# Patient Record
Sex: Female | Born: 1960 | Race: White | Hispanic: No | Marital: Married | Smoking: Never smoker
Health system: Southern US, Community
[De-identification: ages and names within clinical notes are randomized; demographics above are authoritative.]

## PROBLEM LIST (undated history)

## (undated) DIAGNOSIS — Z8571 Personal history of Hodgkin lymphoma: Secondary | ICD-10-CM

## (undated) DIAGNOSIS — D649 Anemia, unspecified: Secondary | ICD-10-CM

## (undated) DIAGNOSIS — T7840XA Allergy, unspecified, initial encounter: Secondary | ICD-10-CM

## (undated) DIAGNOSIS — B029 Zoster without complications: Secondary | ICD-10-CM

## (undated) DIAGNOSIS — C4491 Basal cell carcinoma of skin, unspecified: Secondary | ICD-10-CM

## (undated) DIAGNOSIS — M1990 Unspecified osteoarthritis, unspecified site: Secondary | ICD-10-CM

## (undated) DIAGNOSIS — R011 Cardiac murmur, unspecified: Secondary | ICD-10-CM

## (undated) DIAGNOSIS — R87619 Unspecified abnormal cytological findings in specimens from cervix uteri: Secondary | ICD-10-CM

## (undated) DIAGNOSIS — M8580 Other specified disorders of bone density and structure, unspecified site: Secondary | ICD-10-CM

## (undated) DIAGNOSIS — K219 Gastro-esophageal reflux disease without esophagitis: Secondary | ICD-10-CM

## (undated) DIAGNOSIS — C50919 Malignant neoplasm of unspecified site of unspecified female breast: Secondary | ICD-10-CM

## (undated) HISTORY — DX: Unspecified osteoarthritis, unspecified site: M19.90

## (undated) HISTORY — DX: Unspecified abnormal cytological findings in specimens from cervix uteri: R87.619

## (undated) HISTORY — PX: BASAL CELL CARCINOMA EXCISION: SHX1214

## (undated) HISTORY — DX: Cardiac murmur, unspecified: R01.1

## (undated) HISTORY — DX: Gastro-esophageal reflux disease without esophagitis: K21.9

## (undated) HISTORY — DX: Basal cell carcinoma of skin, unspecified: C44.91

## (undated) HISTORY — PX: LYMPH NODE BIOPSY: SHX201

## (undated) HISTORY — DX: Anemia, unspecified: D64.9

## (undated) HISTORY — DX: Malignant neoplasm of unspecified site of unspecified female breast: C50.919

## (undated) HISTORY — DX: Other specified disorders of bone density and structure, unspecified site: M85.80

## (undated) HISTORY — DX: Allergy, unspecified, initial encounter: T78.40XA

## (undated) HISTORY — DX: Zoster without complications: B02.9

## (undated) HISTORY — DX: Personal history of Hodgkin lymphoma: Z85.71

---

## 1989-03-09 DIAGNOSIS — Z8571 Personal history of Hodgkin lymphoma: Secondary | ICD-10-CM

## 1989-03-09 HISTORY — DX: Personal history of Hodgkin lymphoma: Z85.71

## 2006-04-02 ENCOUNTER — Other Ambulatory Visit: Admission: RE | Admit: 2006-04-02 | Discharge: 2006-04-02 | Payer: Self-pay | Admitting: Obstetrics & Gynecology

## 2007-07-01 ENCOUNTER — Other Ambulatory Visit: Admission: RE | Admit: 2007-07-01 | Discharge: 2007-07-01 | Payer: Self-pay | Admitting: Obstetrics & Gynecology

## 2007-07-01 ENCOUNTER — Encounter: Payer: Self-pay | Admitting: Internal Medicine

## 2007-11-23 ENCOUNTER — Ambulatory Visit: Payer: Self-pay | Admitting: Internal Medicine

## 2007-11-23 ENCOUNTER — Encounter: Payer: Self-pay | Admitting: Internal Medicine

## 2007-11-23 DIAGNOSIS — R05 Cough: Secondary | ICD-10-CM

## 2007-11-23 DIAGNOSIS — D259 Leiomyoma of uterus, unspecified: Secondary | ICD-10-CM | POA: Insufficient documentation

## 2007-11-23 DIAGNOSIS — Z8571 Personal history of Hodgkin lymphoma: Secondary | ICD-10-CM | POA: Insufficient documentation

## 2007-11-23 DIAGNOSIS — Z85828 Personal history of other malignant neoplasm of skin: Secondary | ICD-10-CM

## 2007-11-23 DIAGNOSIS — R059 Cough, unspecified: Secondary | ICD-10-CM | POA: Insufficient documentation

## 2007-11-23 DIAGNOSIS — F4321 Adjustment disorder with depressed mood: Secondary | ICD-10-CM

## 2007-11-24 DIAGNOSIS — J309 Allergic rhinitis, unspecified: Secondary | ICD-10-CM | POA: Insufficient documentation

## 2007-11-28 ENCOUNTER — Ambulatory Visit: Payer: Self-pay | Admitting: Internal Medicine

## 2007-12-08 ENCOUNTER — Ambulatory Visit: Payer: Self-pay | Admitting: Pulmonary Disease

## 2007-12-29 ENCOUNTER — Ambulatory Visit: Payer: Self-pay | Admitting: Pulmonary Disease

## 2007-12-29 DIAGNOSIS — K219 Gastro-esophageal reflux disease without esophagitis: Secondary | ICD-10-CM | POA: Insufficient documentation

## 2008-07-07 HISTORY — PX: BREAST LUMPECTOMY: SHX2

## 2008-07-07 HISTORY — PX: BREAST BIOPSY: SHX20

## 2008-08-01 ENCOUNTER — Ambulatory Visit: Payer: Self-pay | Admitting: Oncology

## 2008-08-08 ENCOUNTER — Encounter: Admission: RE | Admit: 2008-08-08 | Discharge: 2008-08-08 | Payer: Self-pay | Admitting: General Surgery

## 2008-08-28 ENCOUNTER — Encounter (INDEPENDENT_AMBULATORY_CARE_PROVIDER_SITE_OTHER): Payer: Self-pay | Admitting: General Surgery

## 2008-08-28 ENCOUNTER — Ambulatory Visit (HOSPITAL_COMMUNITY): Admission: RE | Admit: 2008-08-28 | Discharge: 2008-08-29 | Payer: Self-pay | Admitting: General Surgery

## 2008-08-28 HISTORY — PX: MASTECTOMY: SHX3

## 2008-09-06 ENCOUNTER — Ambulatory Visit: Admission: RE | Admit: 2008-09-06 | Discharge: 2008-10-11 | Payer: Self-pay | Admitting: Radiation Oncology

## 2008-09-06 HISTORY — PX: PORTACATH PLACEMENT: SHX2246

## 2008-09-12 ENCOUNTER — Encounter: Payer: Self-pay | Admitting: Internal Medicine

## 2008-09-12 ENCOUNTER — Ambulatory Visit: Payer: Self-pay | Admitting: Oncology

## 2008-09-12 LAB — COMPREHENSIVE METABOLIC PANEL
ALT: 87 U/L — ABNORMAL HIGH (ref 0–35)
AST: 52 U/L — ABNORMAL HIGH (ref 0–37)
CO2: 31 mEq/L (ref 19–32)
Chloride: 99 mEq/L (ref 96–112)
Sodium: 138 mEq/L (ref 135–145)
Total Bilirubin: 0.6 mg/dL (ref 0.3–1.2)
Total Protein: 6.9 g/dL (ref 6.0–8.3)

## 2008-09-12 LAB — CBC WITH DIFFERENTIAL/PLATELET
Eosinophils Absolute: 0.3 10*3/uL (ref 0.0–0.5)
HCT: 37 % (ref 34.8–46.6)
HGB: 12.8 g/dL (ref 11.6–15.9)
LYMPH%: 16.7 % (ref 14.0–49.7)
MONO#: 0.6 10*3/uL (ref 0.1–0.9)
NEUT#: 5.4 10*3/uL (ref 1.5–6.5)
NEUT%: 70.4 % (ref 38.4–76.8)
Platelets: 436 10*3/uL — ABNORMAL HIGH (ref 145–400)
WBC: 7.7 10*3/uL (ref 3.9–10.3)
lymph#: 1.3 10*3/uL (ref 0.9–3.3)

## 2008-09-12 LAB — LACTATE DEHYDROGENASE: LDH: 134 U/L (ref 94–250)

## 2008-09-13 LAB — TSH: TSH: 3.756 u[IU]/mL (ref 0.350–4.500)

## 2008-09-26 ENCOUNTER — Ambulatory Visit (HOSPITAL_COMMUNITY): Admission: RE | Admit: 2008-09-26 | Discharge: 2008-09-26 | Payer: Self-pay | Admitting: Oncology

## 2008-10-02 ENCOUNTER — Ambulatory Visit (HOSPITAL_COMMUNITY): Admission: AD | Admit: 2008-10-02 | Discharge: 2008-10-02 | Payer: Self-pay | Admitting: General Surgery

## 2008-10-03 ENCOUNTER — Encounter: Payer: Self-pay | Admitting: Internal Medicine

## 2008-10-07 DIAGNOSIS — C50919 Malignant neoplasm of unspecified site of unspecified female breast: Secondary | ICD-10-CM

## 2008-10-07 HISTORY — DX: Malignant neoplasm of unspecified site of unspecified female breast: C50.919

## 2008-10-09 LAB — COMPREHENSIVE METABOLIC PANEL
Albumin: 4.2 g/dL (ref 3.5–5.2)
CO2: 26 mEq/L (ref 19–32)
Glucose, Bld: 144 mg/dL — ABNORMAL HIGH (ref 70–99)
Sodium: 136 mEq/L (ref 135–145)
Total Bilirubin: 0.7 mg/dL (ref 0.3–1.2)
Total Protein: 7.8 g/dL (ref 6.0–8.3)

## 2008-10-09 LAB — HEPATITIS B CORE ANTIBODY, TOTAL: Hep B Core Total Ab: NEGATIVE

## 2008-10-09 LAB — HEPATITIS B SURFACE ANTIGEN: Hepatitis B Surface Ag: NEGATIVE

## 2008-10-10 ENCOUNTER — Encounter: Payer: Self-pay | Admitting: Internal Medicine

## 2008-10-11 ENCOUNTER — Ambulatory Visit: Payer: Self-pay | Admitting: Oncology

## 2008-10-15 LAB — CBC WITH DIFFERENTIAL/PLATELET
Basophils Absolute: 0.1 10*3/uL (ref 0.0–0.1)
Eosinophils Absolute: 0.2 10*3/uL (ref 0.0–0.5)
HCT: 36.7 % (ref 34.8–46.6)
HGB: 12.8 g/dL (ref 11.6–15.9)
MONO#: 0.1 10*3/uL (ref 0.1–0.9)
NEUT%: 30.6 % — ABNORMAL LOW (ref 38.4–76.8)
WBC: 1.5 10*3/uL — ABNORMAL LOW (ref 3.9–10.3)
lymph#: 0.8 10*3/uL — ABNORMAL LOW (ref 0.9–3.3)

## 2008-10-29 LAB — CBC WITH DIFFERENTIAL/PLATELET
Basophils Absolute: 0.1 10*3/uL (ref 0.0–0.1)
Eosinophils Absolute: 0.2 10*3/uL (ref 0.0–0.5)
HGB: 12.8 g/dL (ref 11.6–15.9)
LYMPH%: 17.5 % (ref 14.0–49.7)
MCV: 87.3 fL (ref 79.5–101.0)
MONO%: 9.6 % (ref 0.0–14.0)
NEUT#: 4.3 10*3/uL (ref 1.5–6.5)
Platelets: 227 10*3/uL (ref 145–400)
RDW: 12.9 % (ref 11.2–14.5)

## 2008-10-30 LAB — COMPREHENSIVE METABOLIC PANEL
CO2: 24 mEq/L (ref 19–32)
Calcium: 9.8 mg/dL (ref 8.4–10.5)
Glucose, Bld: 143 mg/dL — ABNORMAL HIGH (ref 70–99)
Sodium: 137 mEq/L (ref 135–145)
Total Bilirubin: 0.5 mg/dL (ref 0.3–1.2)
Total Protein: 7.5 g/dL (ref 6.0–8.3)

## 2008-11-06 LAB — CBC WITH DIFFERENTIAL/PLATELET
Eosinophils Absolute: 0.3 10*3/uL (ref 0.0–0.5)
LYMPH%: 16 % (ref 14.0–49.7)
MONO#: 2.3 10*3/uL — ABNORMAL HIGH (ref 0.1–0.9)
NEUT#: 5.5 10*3/uL (ref 1.5–6.5)
Platelets: 267 10*3/uL (ref 145–400)
RBC: 4.36 10*6/uL (ref 3.70–5.45)
RDW: 13.2 % (ref 11.2–14.5)
WBC: 9.8 10*3/uL (ref 3.9–10.3)
lymph#: 1.6 10*3/uL (ref 0.9–3.3)

## 2008-11-16 ENCOUNTER — Ambulatory Visit: Payer: Self-pay | Admitting: Oncology

## 2008-11-20 LAB — CBC WITH DIFFERENTIAL/PLATELET
Eosinophils Absolute: 0 10*3/uL (ref 0.0–0.5)
HCT: 34.4 % — ABNORMAL LOW (ref 34.8–46.6)
HGB: 11.7 g/dL (ref 11.6–15.9)
LYMPH%: 3.9 % — ABNORMAL LOW (ref 14.0–49.7)
MONO#: 0.6 10*3/uL (ref 0.1–0.9)
NEUT#: 10.8 10*3/uL — ABNORMAL HIGH (ref 1.5–6.5)
Platelets: 383 10*3/uL (ref 145–400)
RBC: 3.95 10*6/uL (ref 3.70–5.45)
WBC: 11.9 10*3/uL — ABNORMAL HIGH (ref 3.9–10.3)

## 2008-11-20 LAB — COMPREHENSIVE METABOLIC PANEL
BUN: 15 mg/dL (ref 6–23)
CO2: 26 mEq/L (ref 19–32)
Creatinine, Ser: 0.52 mg/dL (ref 0.40–1.20)
Glucose, Bld: 123 mg/dL — ABNORMAL HIGH (ref 70–99)
Total Bilirubin: 0.7 mg/dL (ref 0.3–1.2)
Total Protein: 6.7 g/dL (ref 6.0–8.3)

## 2008-12-11 LAB — CBC WITH DIFFERENTIAL/PLATELET
Eosinophils Absolute: 0 10*3/uL (ref 0.0–0.5)
HCT: 35.9 % (ref 34.8–46.6)
LYMPH%: 2.8 % — ABNORMAL LOW (ref 14.0–49.7)
MCHC: 33.4 g/dL (ref 31.5–36.0)
MCV: 88.9 fL (ref 79.5–101.0)
MONO#: 0.4 10*3/uL (ref 0.1–0.9)
MONO%: 3.5 % (ref 0.0–14.0)
NEUT#: 11.7 10*3/uL — ABNORMAL HIGH (ref 1.5–6.5)
NEUT%: 93.6 % — ABNORMAL HIGH (ref 38.4–76.8)
Platelets: 342 10*3/uL (ref 145–400)
RBC: 4.04 10*6/uL (ref 3.70–5.45)
WBC: 12.5 10*3/uL — ABNORMAL HIGH (ref 3.9–10.3)

## 2008-12-11 LAB — COMPREHENSIVE METABOLIC PANEL
ALT: 26 U/L (ref 0–35)
Alkaline Phosphatase: 95 U/L (ref 39–117)
CO2: 25 mEq/L (ref 19–32)
Creatinine, Ser: 0.53 mg/dL (ref 0.40–1.20)
Sodium: 135 mEq/L (ref 135–145)
Total Bilirubin: 0.9 mg/dL (ref 0.3–1.2)
Total Protein: 6.8 g/dL (ref 6.0–8.3)

## 2008-12-18 ENCOUNTER — Ambulatory Visit: Payer: Self-pay | Admitting: Oncology

## 2008-12-18 LAB — CBC WITH DIFFERENTIAL/PLATELET
BASO%: 0.9 % (ref 0.0–2.0)
EOS%: 2.2 % (ref 0.0–7.0)
HCT: 36.1 % (ref 34.8–46.6)
LYMPH%: 8 % — ABNORMAL LOW (ref 14.0–49.7)
MCH: 30.5 pg (ref 25.1–34.0)
MCHC: 33.8 g/dL (ref 31.5–36.0)
MONO%: 17.9 % — ABNORMAL HIGH (ref 0.0–14.0)
NEUT%: 71 % (ref 38.4–76.8)
Platelets: 233 10*3/uL (ref 145–400)
RBC: 4 10*6/uL (ref 3.70–5.45)
nRBC: 0 % (ref 0–0)

## 2009-01-01 LAB — CBC WITH DIFFERENTIAL/PLATELET
Basophils Absolute: 0 10*3/uL (ref 0.0–0.1)
EOS%: 0 % (ref 0.0–7.0)
HCT: 34.1 % — ABNORMAL LOW (ref 34.8–46.6)
HGB: 11.4 g/dL — ABNORMAL LOW (ref 11.6–15.9)
LYMPH%: 3.1 % — ABNORMAL LOW (ref 14.0–49.7)
MCH: 30.4 pg (ref 25.1–34.0)
MCV: 90.9 fL (ref 79.5–101.0)
NEUT%: 87.1 % — ABNORMAL HIGH (ref 38.4–76.8)
Platelets: 381 10*3/uL (ref 145–400)
lymph#: 0.3 10*3/uL — ABNORMAL LOW (ref 0.9–3.3)

## 2009-01-01 LAB — COMPREHENSIVE METABOLIC PANEL
AST: 26 U/L (ref 0–37)
Alkaline Phosphatase: 92 U/L (ref 39–117)
BUN: 16 mg/dL (ref 6–23)
Calcium: 9.5 mg/dL (ref 8.4–10.5)
Creatinine, Ser: 0.52 mg/dL (ref 0.40–1.20)
Total Bilirubin: 0.6 mg/dL (ref 0.3–1.2)

## 2009-01-08 LAB — CBC WITH DIFFERENTIAL/PLATELET
Basophils Absolute: 0.1 10*3/uL (ref 0.0–0.1)
EOS%: 2.4 % (ref 0.0–7.0)
LYMPH%: 10.9 % — ABNORMAL LOW (ref 14.0–49.7)
MCH: 30.7 pg (ref 25.1–34.0)
MCV: 91.3 fL (ref 79.5–101.0)
MONO%: 17.1 % — ABNORMAL HIGH (ref 0.0–14.0)
Platelets: 229 10*3/uL (ref 145–400)
RBC: 3.81 10*6/uL (ref 3.70–5.45)
RDW: 16.9 % — ABNORMAL HIGH (ref 11.2–14.5)
nRBC: 0 % (ref 0–0)

## 2009-01-18 ENCOUNTER — Ambulatory Visit: Payer: Self-pay | Admitting: Oncology

## 2009-01-22 LAB — CBC WITH DIFFERENTIAL/PLATELET
BASO%: 0 % (ref 0.0–2.0)
EOS%: 0 % (ref 0.0–7.0)
LYMPH%: 4.1 % — ABNORMAL LOW (ref 14.0–49.7)
MCH: 31.8 pg (ref 25.1–34.0)
MCHC: 33.8 g/dL (ref 31.5–36.0)
MONO#: 0.6 10*3/uL (ref 0.1–0.9)
NEUT%: 90.1 % — ABNORMAL HIGH (ref 38.4–76.8)
Platelets: 389 10*3/uL (ref 145–400)
RBC: 3.77 10*6/uL (ref 3.70–5.45)
WBC: 10.6 10*3/uL — ABNORMAL HIGH (ref 3.9–10.3)

## 2009-01-22 LAB — COMPREHENSIVE METABOLIC PANEL
ALT: 27 U/L (ref 0–35)
AST: 32 U/L (ref 0–37)
Alkaline Phosphatase: 103 U/L (ref 39–117)
CO2: 26 mEq/L (ref 19–32)
Creatinine, Ser: 0.64 mg/dL (ref 0.40–1.20)
Sodium: 140 mEq/L (ref 135–145)
Total Bilirubin: 0.5 mg/dL (ref 0.3–1.2)
Total Protein: 6.3 g/dL (ref 6.0–8.3)

## 2009-01-29 LAB — CBC WITH DIFFERENTIAL/PLATELET
BASO%: 0.5 % (ref 0.0–2.0)
HCT: 35.8 % (ref 34.8–46.6)
LYMPH%: 12 % — ABNORMAL LOW (ref 14.0–49.7)
MCH: 31.7 pg (ref 25.1–34.0)
MCHC: 33.3 g/dL (ref 31.5–36.0)
MCV: 95.3 fL (ref 79.5–101.0)
MONO%: 11.9 % (ref 0.0–14.0)
NEUT%: 73 % (ref 38.4–76.8)
Platelets: 258 10*3/uL (ref 145–400)
RBC: 3.76 10*6/uL (ref 3.70–5.45)
WBC: 9.5 10*3/uL (ref 3.9–10.3)

## 2009-02-12 ENCOUNTER — Ambulatory Visit: Admission: RE | Admit: 2009-02-12 | Discharge: 2009-03-08 | Payer: Self-pay | Admitting: Radiation Oncology

## 2009-02-13 ENCOUNTER — Encounter: Payer: Self-pay | Admitting: Internal Medicine

## 2009-03-05 ENCOUNTER — Ambulatory Visit: Payer: Self-pay | Admitting: Oncology

## 2009-03-05 LAB — CBC WITH DIFFERENTIAL/PLATELET
Basophils Absolute: 0 10*3/uL (ref 0.0–0.1)
EOS%: 28.9 % — ABNORMAL HIGH (ref 0.0–7.0)
HCT: 38.4 % (ref 34.8–46.6)
HGB: 12.8 g/dL (ref 11.6–15.9)
MCH: 31.5 pg (ref 25.1–34.0)
MCV: 94.3 fL (ref 79.5–101.0)
MONO%: 9.2 % (ref 0.0–14.0)
NEUT%: 46.6 % (ref 38.4–76.8)
Platelets: 255 10*3/uL (ref 145–400)

## 2009-03-07 ENCOUNTER — Ambulatory Visit (HOSPITAL_COMMUNITY): Admission: RE | Admit: 2009-03-07 | Discharge: 2009-03-07 | Payer: Self-pay | Admitting: Obstetrics & Gynecology

## 2009-03-07 HISTORY — PX: LAPAROSCOPIC BILATERAL SALPINGO OOPHERECTOMY: SHX5890

## 2009-03-13 ENCOUNTER — Ambulatory Visit: Admission: RE | Admit: 2009-03-13 | Discharge: 2009-05-14 | Payer: Self-pay | Admitting: Radiation Oncology

## 2009-06-10 ENCOUNTER — Ambulatory Visit: Payer: Self-pay | Admitting: Oncology

## 2009-06-10 LAB — COMPREHENSIVE METABOLIC PANEL
ALT: 17 U/L (ref 0–35)
AST: 19 U/L (ref 0–37)
Calcium: 9.6 mg/dL (ref 8.4–10.5)
Chloride: 102 mEq/L (ref 96–112)
Creatinine, Ser: 0.7 mg/dL (ref 0.40–1.20)
Sodium: 138 mEq/L (ref 135–145)
Total Protein: 7.1 g/dL (ref 6.0–8.3)

## 2009-06-10 LAB — CBC WITH DIFFERENTIAL/PLATELET
BASO%: 0.7 % (ref 0.0–2.0)
EOS%: 10.9 % — ABNORMAL HIGH (ref 0.0–7.0)
HCT: 39.4 % (ref 34.8–46.6)
MCH: 30.8 pg (ref 25.1–34.0)
MCHC: 34.5 g/dL (ref 31.5–36.0)
NEUT%: 59.5 % (ref 38.4–76.8)
RBC: 4.41 10*6/uL (ref 3.70–5.45)
RDW: 14.5 % (ref 11.2–14.5)
WBC: 4.8 10*3/uL (ref 3.9–10.3)
lymph#: 1 10*3/uL (ref 0.9–3.3)

## 2009-06-14 ENCOUNTER — Encounter (INDEPENDENT_AMBULATORY_CARE_PROVIDER_SITE_OTHER): Payer: Self-pay | Admitting: *Deleted

## 2009-06-20 ENCOUNTER — Encounter: Admission: RE | Admit: 2009-06-20 | Discharge: 2009-07-15 | Payer: Self-pay | Admitting: Oncology

## 2009-08-20 ENCOUNTER — Ambulatory Visit (HOSPITAL_BASED_OUTPATIENT_CLINIC_OR_DEPARTMENT_OTHER): Payer: 59 | Admitting: Oncology

## 2009-08-30 LAB — CBC WITH DIFFERENTIAL/PLATELET
Basophils Absolute: 0 10*3/uL (ref 0.0–0.1)
Eosinophils Absolute: 0.3 10*3/uL (ref 0.0–0.5)
HGB: 13 g/dL (ref 11.6–15.9)
MONO#: 0.4 10*3/uL (ref 0.1–0.9)
NEUT#: 2.2 10*3/uL (ref 1.5–6.5)
Platelets: 261 10*3/uL (ref 145–400)
RBC: 4.17 10*6/uL (ref 3.70–5.45)
RDW: 13.4 % (ref 11.2–14.5)
WBC: 4.1 10*3/uL (ref 3.9–10.3)

## 2009-08-31 LAB — TSH: TSH: 2.897 u[IU]/mL (ref 0.350–4.500)

## 2009-08-31 LAB — COMPREHENSIVE METABOLIC PANEL
Albumin: 4.5 g/dL (ref 3.5–5.2)
BUN: 20 mg/dL (ref 6–23)
CO2: 22 mEq/L (ref 19–32)
Calcium: 9.8 mg/dL (ref 8.4–10.5)
Glucose, Bld: 98 mg/dL (ref 70–99)
Potassium: 4.3 mEq/L (ref 3.5–5.3)
Sodium: 139 mEq/L (ref 135–145)
Total Protein: 6.7 g/dL (ref 6.0–8.3)

## 2009-08-31 LAB — VITAMIN D 25 HYDROXY (VIT D DEFICIENCY, FRACTURES): Vit D, 25-Hydroxy: 36 ng/mL (ref 30–89)

## 2010-02-06 HISTORY — PX: TIBIA FRACTURE SURGERY: SHX806

## 2010-02-10 ENCOUNTER — Encounter: Admission: RE | Admit: 2010-02-10 | Discharge: 2010-02-10 | Payer: Self-pay | Admitting: Orthopedic Surgery

## 2010-02-13 ENCOUNTER — Ambulatory Visit (HOSPITAL_COMMUNITY)
Admission: RE | Admit: 2010-02-13 | Discharge: 2010-02-15 | Payer: Self-pay | Source: Home / Self Care | Attending: Orthopedic Surgery | Admitting: Orthopedic Surgery

## 2010-04-08 NOTE — Letter (Signed)
Summary: [facility]  [facility]   Imported By: Maryln Gottron 03/25/2009 10:02:57  _____________________________________________________________________  External Attachment:    Type:   Image     Comment:   External Document

## 2010-04-08 NOTE — Letter (Signed)
Summary: Referral - not able to see patient  [facility]  [address] [place], [place] [address]   Phone: [phone]  Fax: [phone]     June 14, 2009    [facility] [address] [place], [place] [address]    Re:   Denise Hull DOB:  24-Jul-1960 MRN:   [id number]    Dear [facility] :  Thank you for your kind referral of the above patient.  We have attempted to schedule the recommended procedure Screening Colonoscopy but have not been able to schedule because:   X  The patient was not available by phone and/or has not returned our calls.  ___ The patient declined to schedule the procedure at this time.  We appreciate the referral and hope that we will have the opportunity to treat this patient in the future.    Sincerely,    [facility] [phone]

## 2010-04-10 ENCOUNTER — Ambulatory Visit (HOSPITAL_BASED_OUTPATIENT_CLINIC_OR_DEPARTMENT_OTHER): Payer: 59 | Admitting: Oncology

## 2010-04-10 DIAGNOSIS — Z8571 Personal history of Hodgkin lymphoma: Secondary | ICD-10-CM

## 2010-04-10 DIAGNOSIS — C50419 Malignant neoplasm of upper-outer quadrant of unspecified female breast: Secondary | ICD-10-CM

## 2010-04-10 DIAGNOSIS — C50919 Malignant neoplasm of unspecified site of unspecified female breast: Secondary | ICD-10-CM

## 2010-04-10 LAB — CBC WITH DIFFERENTIAL/PLATELET
BASO%: 0.5 % (ref 0.0–2.0)
Basophils Absolute: 0 10*3/uL (ref 0.0–0.1)
EOS%: 3.3 % (ref 0.0–7.0)
HCT: 40.2 % (ref 34.8–46.6)
HGB: 13.7 g/dL (ref 11.6–15.9)
LYMPH%: 27.7 % (ref 14.0–49.7)
MCH: 30.8 pg (ref 25.1–34.0)
MCHC: 34.1 g/dL (ref 31.5–36.0)
MCV: 90.3 fL (ref 79.5–101.0)
NEUT%: 60.9 % (ref 38.4–76.8)
Platelets: 278 10*3/uL (ref 145–400)
lymph#: 1.1 10*3/uL (ref 0.9–3.3)

## 2010-04-11 LAB — COMPREHENSIVE METABOLIC PANEL
ALT: 20 U/L (ref 0–35)
AST: 19 U/L (ref 0–37)
BUN: 19 mg/dL (ref 6–23)
Calcium: 10.2 mg/dL (ref 8.4–10.5)
Chloride: 100 mEq/L (ref 96–112)
Creatinine, Ser: 0.77 mg/dL (ref 0.40–1.20)
Total Bilirubin: 0.6 mg/dL (ref 0.3–1.2)

## 2010-04-11 LAB — CANCER ANTIGEN 27.29: CA 27.29: 17 U/mL (ref 0–39)

## 2010-05-06 ENCOUNTER — Other Ambulatory Visit: Payer: Self-pay | Admitting: Dermatology

## 2010-05-20 LAB — CBC
HCT: 40.5 % (ref 36.0–46.0)
Hemoglobin: 13.8 g/dL (ref 12.0–15.0)
MCH: 30.6 pg (ref 26.0–34.0)
MCHC: 34.1 g/dL (ref 30.0–36.0)

## 2010-05-20 LAB — COMPREHENSIVE METABOLIC PANEL
CO2: 28 mEq/L (ref 19–32)
Calcium: 10.1 mg/dL (ref 8.4–10.5)
Creatinine, Ser: 0.73 mg/dL (ref 0.4–1.2)
GFR calc Af Amer: >60 mL/min (ref >60)
GFR calc non Af Amer: >60 mL/min (ref >60)
Glucose, Bld: 95 mg/dL (ref 70–99)

## 2010-05-20 LAB — APTT: aPTT: 23 seconds — ABNORMAL LOW (ref 24–37)

## 2010-05-20 LAB — URINALYSIS, ROUTINE W REFLEX MICROSCOPIC
Ketones, ur: 15 mg/dL — AB
Nitrite: NEGATIVE
Protein, ur: NEGATIVE mg/dL

## 2010-05-20 LAB — SURGICAL PCR SCREEN
MRSA, PCR: NEGATIVE
Staphylococcus aureus: NEGATIVE

## 2010-05-20 LAB — PROTIME-INR: Prothrombin Time: 13 seconds (ref 11.6–15.2)

## 2010-06-09 LAB — PREGNANCY, URINE: Preg Test, Ur: NEGATIVE

## 2010-06-09 LAB — URINALYSIS, ROUTINE W REFLEX MICROSCOPIC
Bilirubin Urine: NEGATIVE
Ketones, ur: NEGATIVE mg/dL
Nitrite: NEGATIVE
Protein, ur: NEGATIVE mg/dL
Specific Gravity, Urine: 1.015 (ref 1.005–1.030)
Urobilinogen, UA: 0.2 mg/dL (ref 0.0–1.0)

## 2010-06-09 LAB — URINE MICROSCOPIC-ADD ON

## 2010-06-16 LAB — COMPREHENSIVE METABOLIC PANEL
ALT: 27 U/L (ref 0–35)
AST: 27 U/L (ref 0–37)
Albumin: 4.3 g/dL (ref 3.5–5.2)
Alkaline Phosphatase: 75 U/L (ref 39–117)
BUN: 16 mg/dL (ref 6–23)
CO2: 27 mEq/L (ref 19–32)
Calcium: 10.2 mg/dL (ref 8.4–10.5)
Creatinine, Ser: 0.7 mg/dL (ref 0.4–1.2)
Glucose, Bld: 73 mg/dL (ref 70–99)
Total Bilirubin: 0.7 mg/dL (ref 0.3–1.2)

## 2010-06-16 LAB — CBC
HCT: 42.7 % (ref 36.0–46.0)
Hemoglobin: 14.6 g/dL (ref 12.0–15.0)
MCHC: 34.2 g/dL (ref 30.0–36.0)
MCV: 92.1 fL (ref 78.0–100.0)
RBC: 4.63 MIL/uL (ref 3.87–5.11)
RDW: 13.1 % (ref 11.5–15.5)

## 2010-06-16 LAB — DIFFERENTIAL
Basophils Absolute: 0 10*3/uL (ref 0.0–0.1)
Basophils Relative: 1 % (ref 0–1)
Eosinophils Relative: 5 % (ref 0–5)
Lymphocytes Relative: 19 % (ref 12–46)
Monocytes Absolute: 0.6 10*3/uL (ref 0.1–1.0)

## 2010-07-01 IMAGING — CT CT CHEST W/ CM
2 of 4 series · 15 of 36 positions shown, 18 images · IV contrast (agent unspecified)
Comparison: Bone scan done today.

CT CHEST

CLINICAL DATA: Breast cancer status post bilateral mastectomy and
tissue expander placement.  Elevated liver function tests.

CT CHEST AND ABDOMEN WITH CONTRAST
TECHNIQUE: Multidetector CT imaging of the chest and abdomen was
performed following the standard protocol during bolus
administration of intravenous contrast. (The patient originally had
been scheduled for MRI which was attempted; this was nondiagnostic
due to artifact related to the breast tissue expanders.)
Contrast: 80 ml Mmnipaque-F88 intravenously.

[Series 2: chest with st · axial · 0.76mm/px · z∈[-414,-34]mm · 12 of 88 slices shown, 15 images]
[im 6/88  mediastinal]
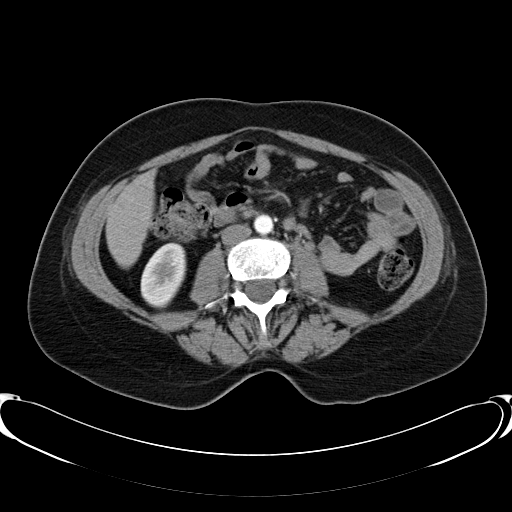
[im 6/88  lung]
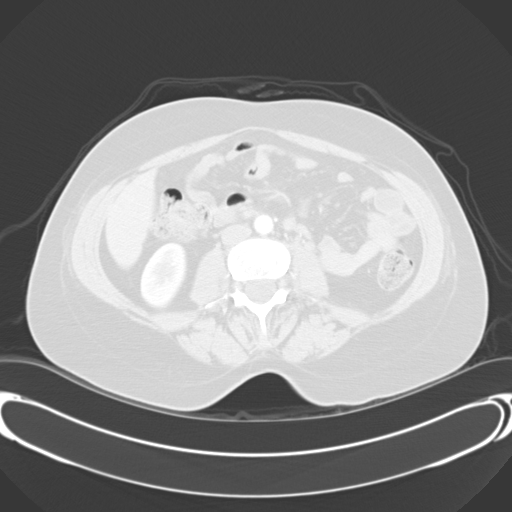
[im 12/88  lung]
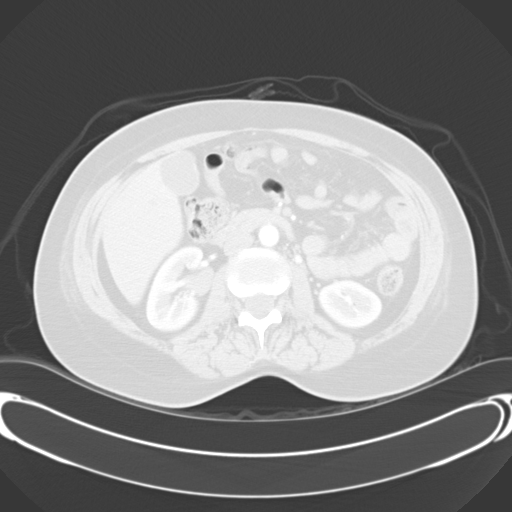
[im 18/88  lung]
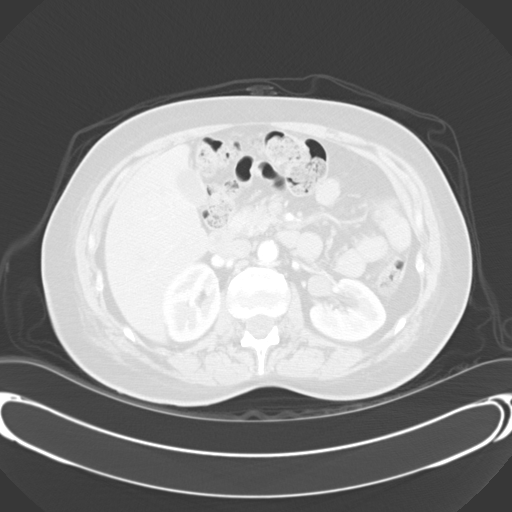
[im 30/88  lung]
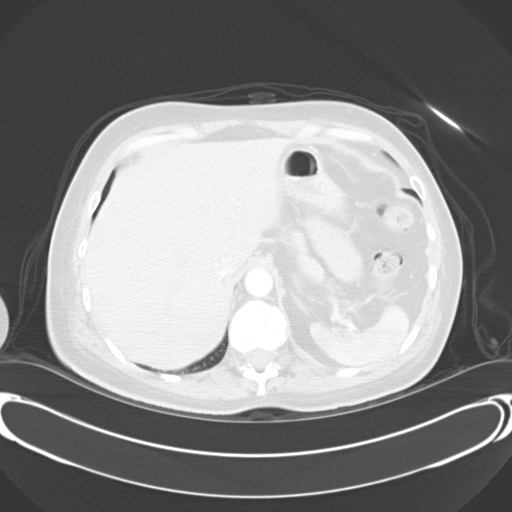
[im 35/88  mediastinal]
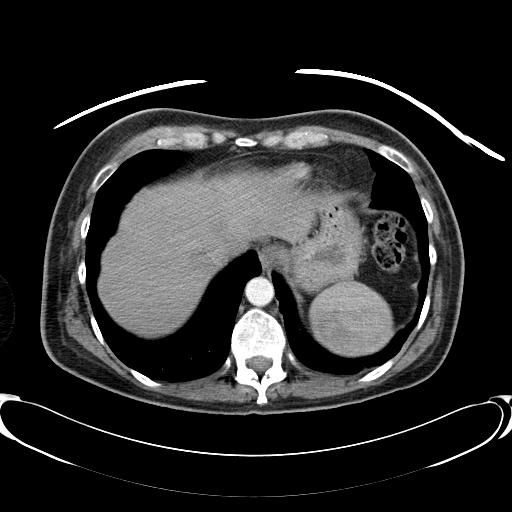
[im 35/88  lung]
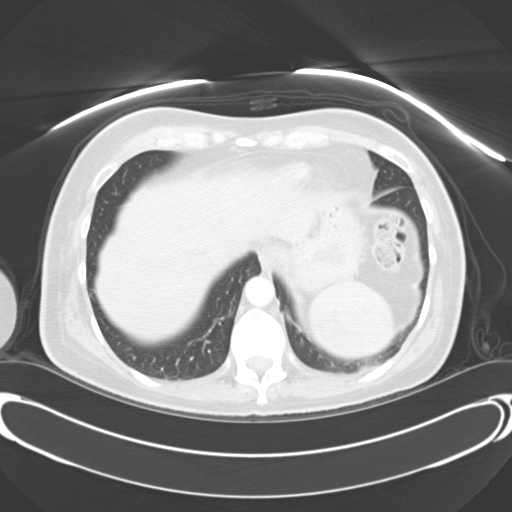
[im 41/88  lung]
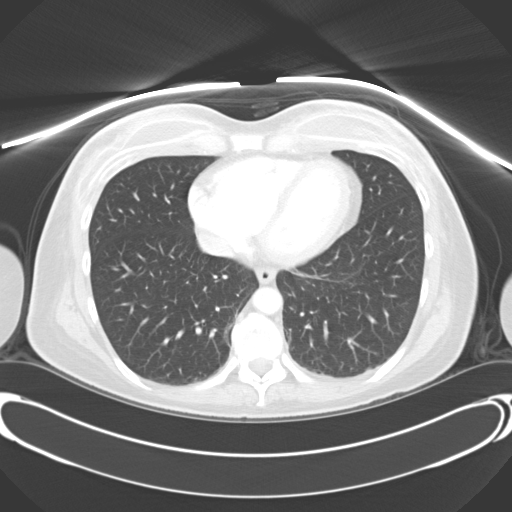
[im 47/88  lung]
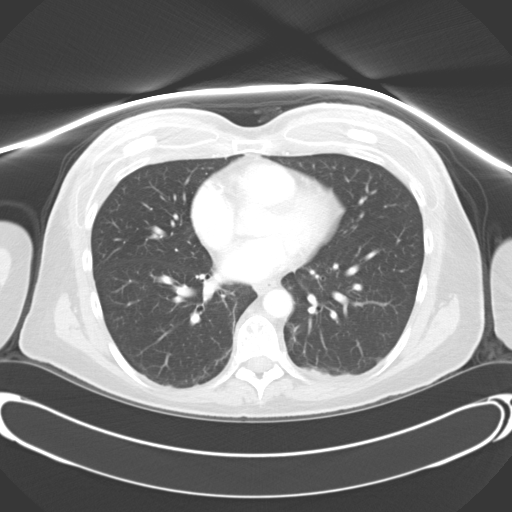
[im 53/88  lung]
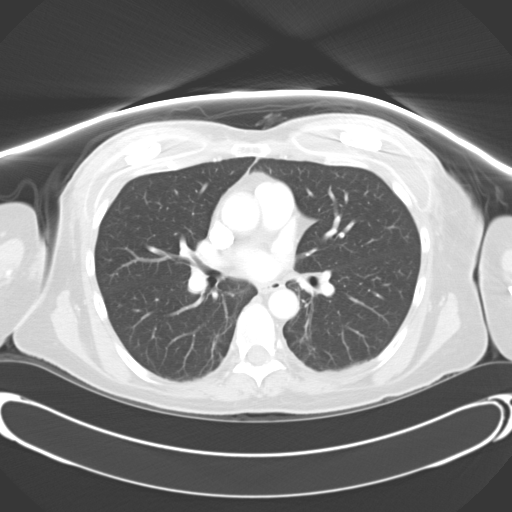
[im 59/88  mediastinal]
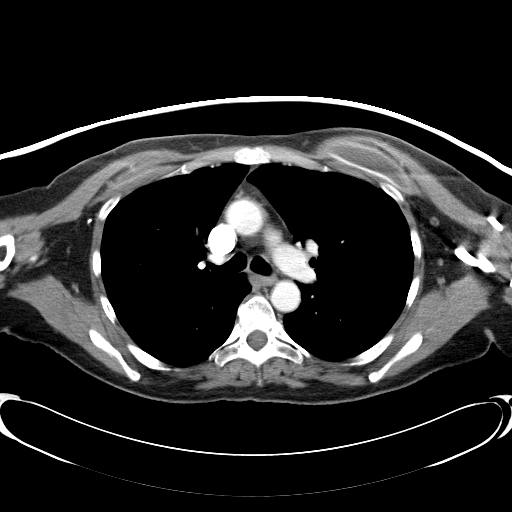
[im 59/88  lung]
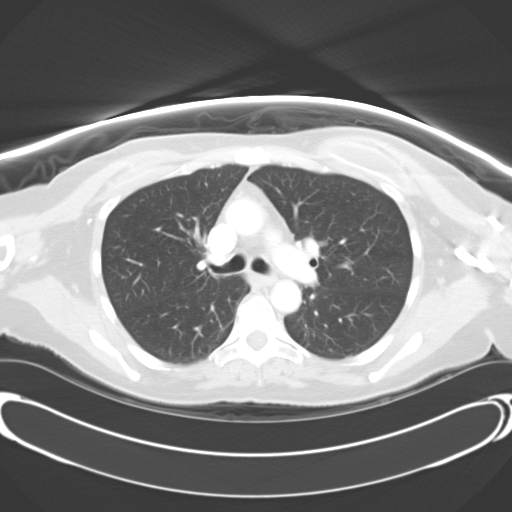
[im 70/88  lung]
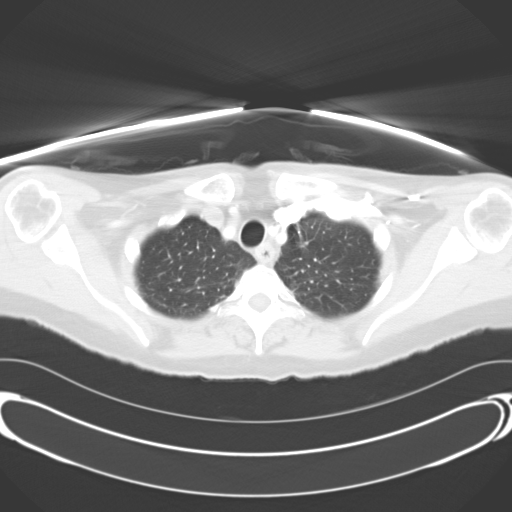
[im 76/88  lung]
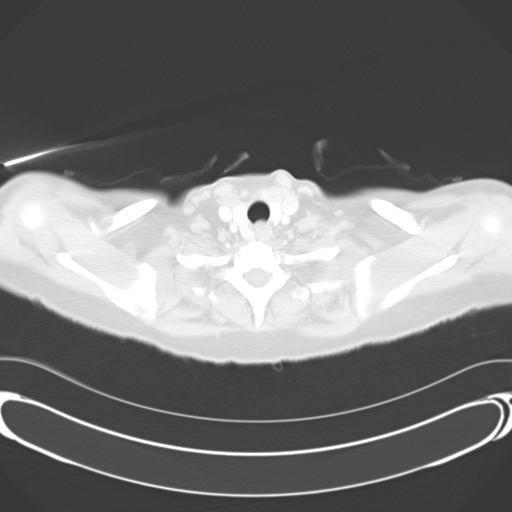
[im 82/88  lung]
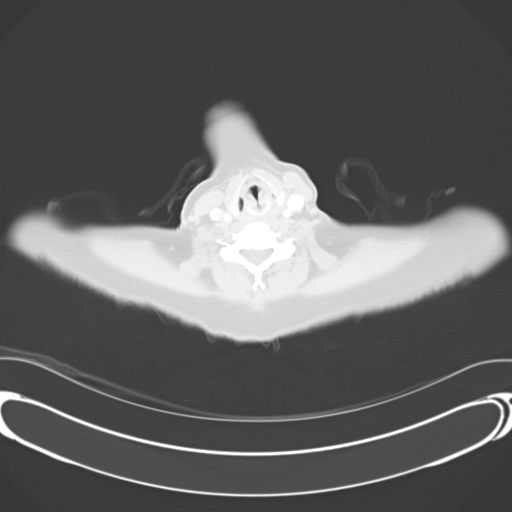

[Series 602: <mpr thick range> · coronal · 0.86mm/px · 3 of 83 slices shown]
[im 17/83  lung]
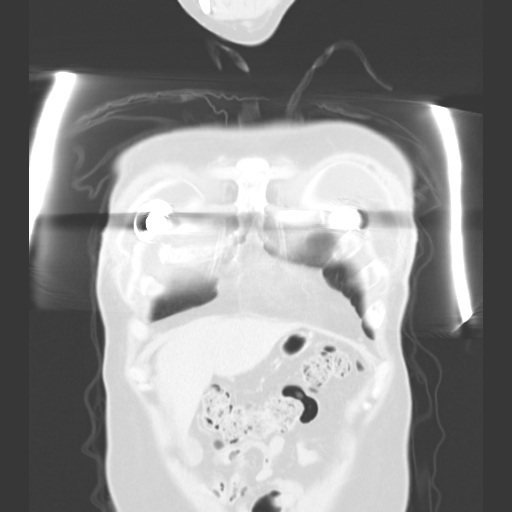
[im 33/83  lung]
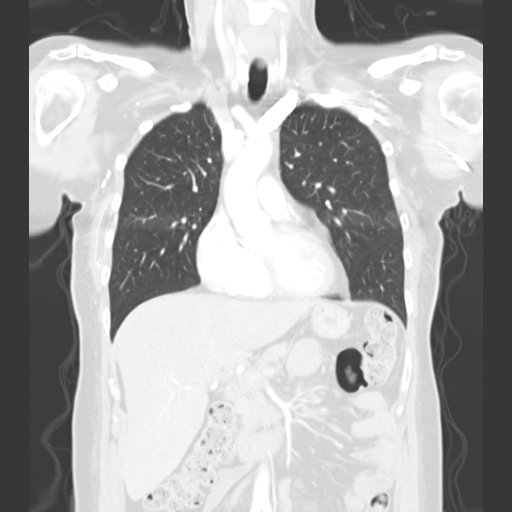
[im 50/83  lung]
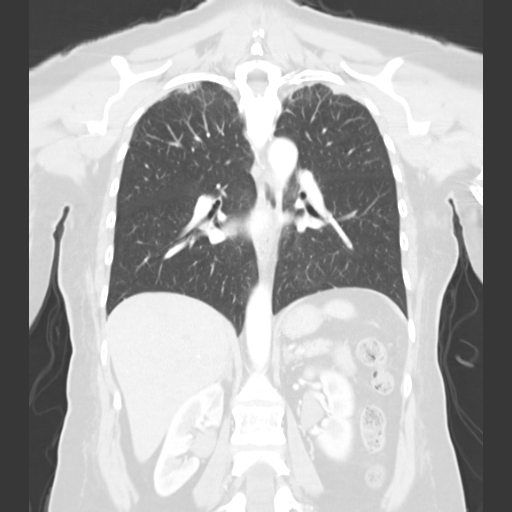

[15 of 36 positions shown; findings below may reference images not displayed]

FINDINGS: There are postsurgical changes status post bilateral
mastectomy, tissue expander placement and axillary node dissection
on the left.  No chest wall mass or axillary lymphadenopathy is
identified.  There is no internal mammary, mediastinal or hilar
lymphadenopathy.

A trace amount of pleural fluid is present on the left.  There is
no right pleural effusion or pericardial effusion.  Symmetric
biapical subpleural density is noted on images 14 - 16.  There is a
3 mm right lower lobe nodule on image 46.  No suspicious osseous
findings are demonstrated.
IMPRESSION: 1.  Postsurgical changes status post bilateral mastectomy.  No
evidence of chest wall tumor.
2.  No findings highly suspicious for metastatic disease.  A tiny
right lower lobe lung nodule is likely postinflammatory.  Biapical
pulmonary densities are compatible with scarring, possibly related
to prior inflammation or radiation.

CT ABDOMEN
FINDINGS: The liver appears normal in density and demonstrates no
focal abnormality.  There is no biliary dilatation.  The
gallbladder is present and appears normal.  The spleen, pancreas,
adrenal glands and kidneys appear normal.  There is no adenopathy
or suspicious osseous lesion.
IMPRESSION: 1.  No evidence of abdominal metastatic disease.
2.  No hepatic or biliary abnormality is identified to account for
the patient's elevated liver function tests.  Correlate clinically.

## 2010-07-07 IMAGING — CR DG CHEST 1V PORT
1 series · 1 of 1 positions shown · non-contrast
Comparison: Intraoperative exam 10/02/2008.

CLINICAL DATA: Port-A-Cath placement.

PORTABLE CHEST - 1 VIEW

[view not recorded]
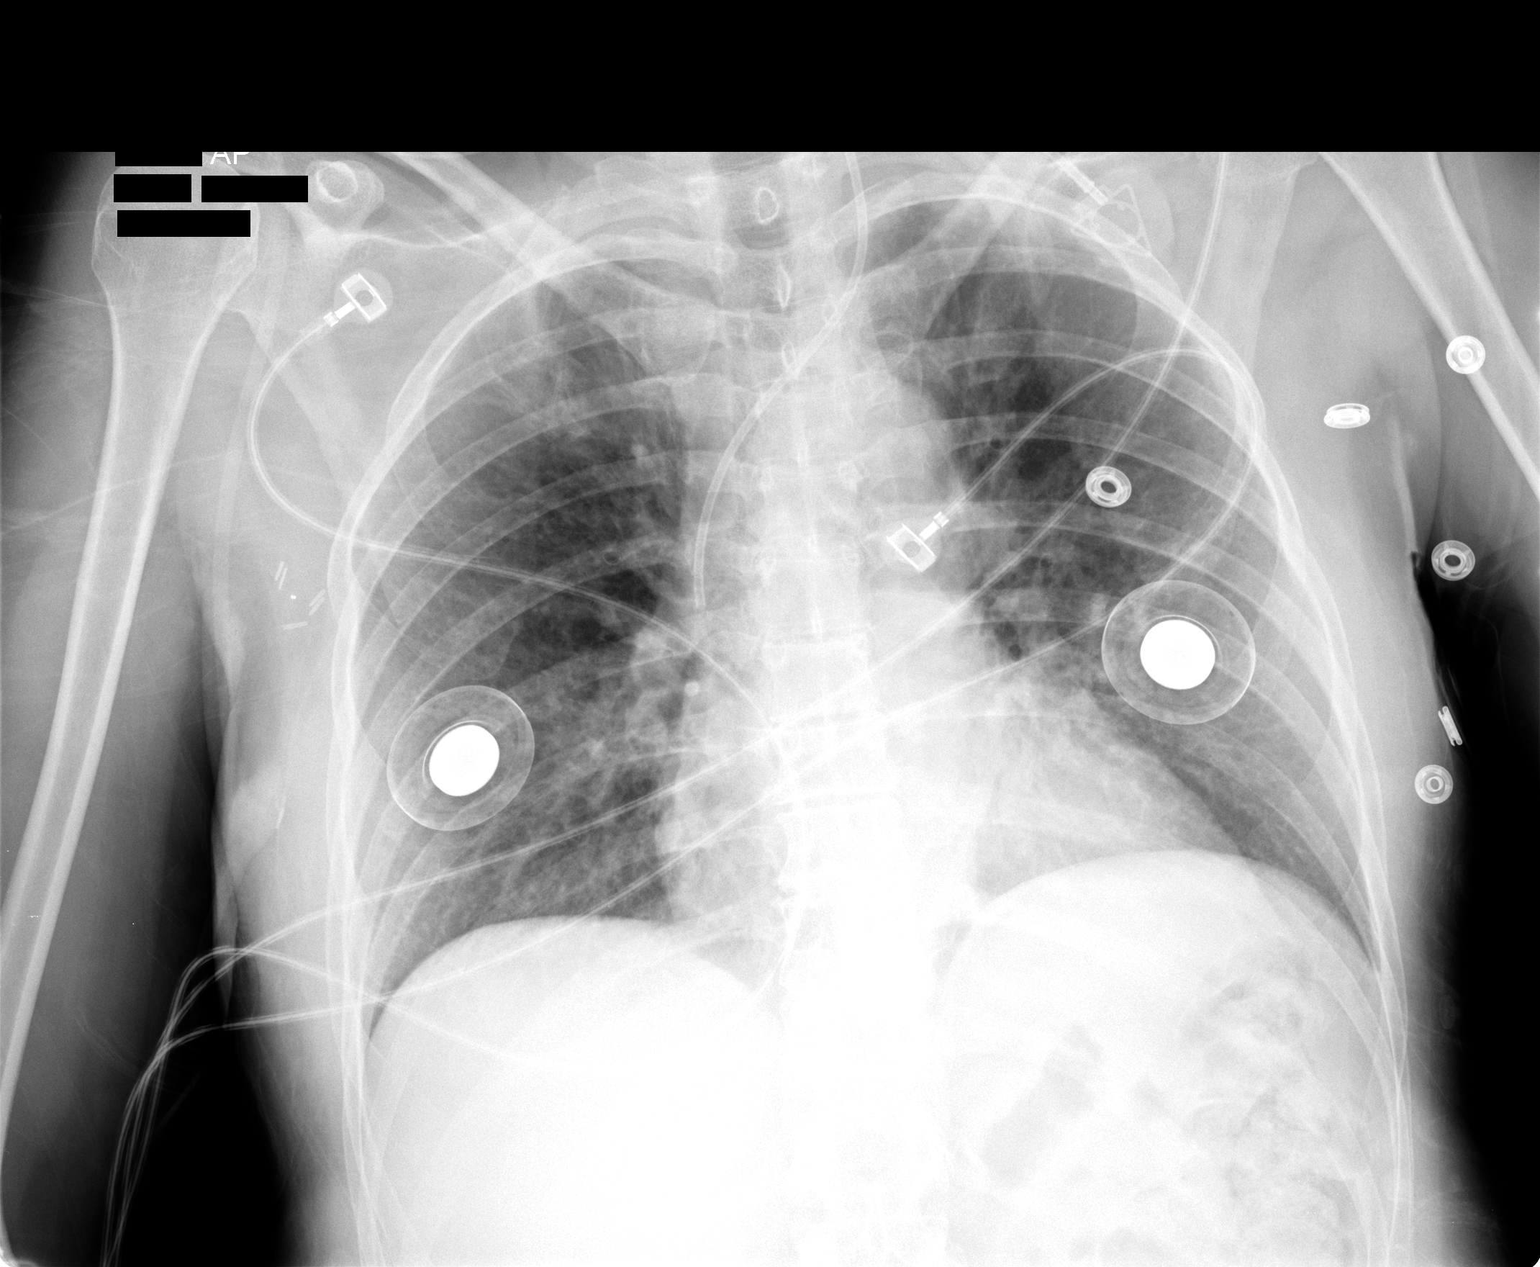

[1 of 1 positions shown; findings below may reference images not displayed]

FINDINGS: The left central line catheter tip has been advanced with
the tip projecting beyond the region of the azygos vein at the mid
superior vena cava level.  This is with the patient in a semi erect
position.  No gross pneumothorax.  Mild pulmonary vascular
prominence.  Heart size top normal.  Minimally tortuous aorta.  CT
detected pulmonary parenchymal changes (09/26/2008 CT) not as well
appreciated on the present plain film examination.
IMPRESSION: Left central line tip mid superior vena cava without gross
pneumothorax.  Please see above.

## 2010-07-22 NOTE — Op Note (Signed)
NAMEBREASIA, KARGES           ACCOUNT NO.:  [id number]   MEDICAL RECORD NO.:  [id number]          PATIENT TYPE:  AMB   LOCATION:  DAY                          FACILITY:  [facility]   PHYSICIAN:  Ollen Gross. Vernell Morgans, M.D. DATE OF BIRTH:  1960-07-16   DATE OF PROCEDURE:  10/02/2008  DATE OF DISCHARGE:  10/02/2008                               OPERATIVE REPORT   PREOPERATIVE DIAGNOSIS:  Bilateral breast cancer.   POSTOPERATIVE DIAGNOSIS:  Bilateral breast cancer.   PROCEDURE:  Placement of left IJ Port-A-Cath.   SURGEON:  Dr. Carolynne Edouard.   ANESTHESIA:  General via LMA.   PROCEDURE:  After informed consent was obtained, the patient was brought  to the operating and placed in the supine position on the operating  table.  After induction of general anesthesia, the patient's neck and  chest area were prepped with Betadine and draped in the usual sterile  manner.  Initially, a small 22-gauge needle and 10 mL syringe were used  to find the left internal jugular vein.  This was done with the patient  in Trendelenburg position.  Once this was located, a large bore finder  needle was used to access the left internal jugular vein.  A wire was  then placed through the needle without difficulty using the Seldinger  technique.  The needle was then removed.  Placement of the wire in the  central venous system was confer confirmed using real-time fluoroscopy.  At this point on the left chest just below the bend of the clavicle, a  small transverse incision was made.  The area was infiltrated 1%  lidocaine with quarter-percent Marcaine.  A subcutaneous pocket was  created for the well of the PowerPort.  Blunt dissection was then used  to tunnel from the chest wall incision up to the neck incision.  A  tendon passer was then used to bring the tubing through the tunnel  track.  The tubing was placed on the well of the PowerPort and the  PowerPort was placed in the pocket.  The length of the tubing was  estimated using real-time fluoroscopy and cut to about 27 cm.  Next, the  sheath and dilator were placed over the wire also using the Seldinger  technique without difficulty.  The dilator and wire were then removed.  A thumb was placed over the opening of the sheath.  The tubing was then  fed through the sheath as far as it could be fed.  The sheath was then  gently cracked and separated keeping the tubing in place.  Once this was  accomplished, we were able to aspirate blood easily and flushed the port  easily.  The position of the tip of the catheter was determined using  real-time fluoroscopy and the tip of the catheter was in the proximal  superior vena cava.  This was confirmed with the radiologist.  At this  point, the anchor was placed on the well.  The well was anchored in the  pocket using 2-0 Prolene stitches.  The port was then aspirated and  aspirated easily and flushed with a more concentrated heparin  solution.  The chest wall incision was closed with a deep layer of interrupted 3-0  Vicryl stitches and the skin of both incisions was closed with 4-0  Monocryl  subcuticular stitch.  Dermabond dressings were applied.  The patient  tolerated the procedure well.  At the end of the case, all needle,  sponge and instrument counts were correct.  The patient was then  awakened and taken to the recovery room in stable condition.      Ollen Gross. Vernell Morgans, M.D.  Electronically Signed     PST/MEDQ  D:  10/02/2008  T:  10/03/2008  Job:  [id number]

## 2010-07-22 NOTE — Op Note (Signed)
NAMEFENIX, RUPPE NO.:  [id number]   MEDICAL RECORD NO.:  [id number]          PATIENT TYPE:  OIB   LOCATION:  5118                         FACILITY:  [facility]   PHYSICIAN:  Ollen Gross. Vernell Morgans, M.D. DATE OF BIRTH:  01-02-1961   DATE OF PROCEDURE:  08/28/2008  DATE OF DISCHARGE:                               OPERATIVE REPORT   PREOPERATIVE DIAGNOSIS:  Right breast cancer.   POSTOPERATIVE DIAGNOSIS:  Right breast cancer.   PROCEDURE:  Bilateral mastectomies and right sentinel lymph node biopsy  x2 with injection of blue dye.   SURGEON:  Ollen Gross. Vernell Morgans, MD   ANESTHESIA:  General endotracheal.   PROCEDURE:  After informed consent was obtained, the patient was brought  to the operating room and placed in the supine position on the operating  room table.  After adequate induction of general anesthesia, the  patient's chest and axillary area bilaterally was prepped with Betadine  and draped in usual sterile manner.  Earlier in the day, the patient had  undergone injection of 1 mCi of technetium sulfur colloid in the  subareolar position in the right breast.  At this point, 2 mL of  methylene blue and 3 mL of injectable saline were also injected in the  subareolar position of the right breast.  The inframammary folds were  marked.  Attention was first turned to the left breast.  A sort of skin-  sparing elliptical incision was made around the nipple-areolar complex  with a 10 blade knife.  This incision was carried down through the skin  and subcutaneous tissue sharply with the electrocautery.  Skin hooks  were used to elevate the skin anteriorly towards the ceiling and thin  skin flaps were created circumferentially around the incision and this  dissection was taken down all the way to the chest wall laterally.  The  dissection was taken down just to the latissimus muscle.  Once this was  accomplished, then the breast was taken off the pectoralis muscle with  pectoralis fascia.  This was all done sharply also with the  electrocautery.  Once this was accomplished, the operative bed was  examined and found to be hemostatic.  A moist lap was placed along the  chest wall and this operative bed was covered with sterile green towels.  Attention was then turned to the right breast.  A similar skin-sparing  elliptical incision was made around the nipple-areolar complex with a 10  blade knife.  This incision was carried down through the skin and  subcutaneous tissue sharply with the electrocautery.  Once into the  breast tissue, the skin hooks were used to elevate the skin flaps  anteriorly and thin skin flaps were created circumferentially and this  dissection was carried down to the chest wall circumferentially.  Laterally, the dissection was carried down to the latissimus muscle.  Once the superior flap was created, the NeoProbe was used to identify  the hot spot in the right axilla using some sharp dissection with the  electrocautery and some blunt hemostat dissection.  Two hot blue lymph  nodes were identified.  Ex  vivo counts on #1 were about 1700 and on #2  were about 200.  These were removed with the harmonic scalpel and then  sent to pathology for touch preps.  Touch preps on these nodes were  negative for any tumor.  At this point, the inferior flap was created in  the similar manner and then the breast was removed from the chest wall  with pectoralis fascia sharply with the electrocautery.  Once each  breast was removed, it was marked with a stitch on the lateral aspect of  the skin and sent to pathology for further evaluation.  Again, a moist  lap was placed  along the operative bed.  Once it was confirmed, it was all hemostatic  and the skin was healthy.  At this point, the operation was turned over  to Dr. Odis Luster for placement of expanders.  The patient was in stable  condition.  Dr. Odis Luster' portion of the case will be dictated  separately.      Ollen Gross. Vernell Morgans, M.D.  Electronically Signed     PST/MEDQ  D:  08/28/2008  T:  08/29/2008  Job:  [id number]

## 2010-07-22 NOTE — Op Note (Signed)
[name], [name].:  [id number]   MEDICAL RECORD Denise.:  [id number]          [place] TYPE:  OIB   LOCATION:  5118                         FACILITY:  [facility]   PHYSICIAN:  Etter Sjogren, M.D.     DATE OF BIRTH:  Jul 17, 1960   DATE OF PROCEDURE:  08/28/2008  DATE OF DISCHARGE:                               OPERATIVE REPORT   PREOPERATIVE DIAGNOSIS:  Breast cancer.   POSTOPERATIVE DIAGNOSIS:  Breast cancer.   PROCEDURE PERFORMED:  Bilateral breast reconstruction with tissue  expander.   SURGEON:  Etter Sjogren, MD   ANESTHESIA:  General.   ESTIMATED BLOOD LOSS:  Minimal.   DRAINS:  Three 19-French drains on the right, two 19-French drains on  the left.   CLINICAL NOTE:  A 50 year old woman has breast cancer, will be having  bilateral mastectomy and was interested in breast reconstruction.  Options discussed.  She selected tissue expanders as a planned stage  procedure followed by eventual placement of implant.  These procedures  were discussed with her, risks plus complications including, but not  limited to bleeding, infection, anesthesia complications, healing  problems, scarring, loss of sensation, failure of the expanders,  displacement of the expanders, asymmetries, capsular contracture,  disappointment, chronic pain, pulmonary embolism, pneumothorax, and she  understood all this and wished to proceed.   DESCRIPTION OF PROCEDURE:  The Hull was in the operating room and  mastectomy was completed.  The dissection was then carried deep to the  pectoralis major and serratus anterior muscles using electrocautery.  Great care will be taken to avoid damage to underlying chest cavity.  Submuscular spaces were developed and irrigation with saline and  meticulous hemostasis with electrocautery.  A 19-French drains were  positioned one underneath the submuscular space and another under the  mastectomy flaps and on the right side an additional drain in the  area  of the sentinel lymph node biopsy.  These drains were brought through  separate stab wounds inferolaterally and secured with 3-0 Prolene  sutures.  Thorough irrigation with saline, thorough irrigation with  antibiotic solution, meticulous hemostasis with electrocautery.  The  tissue expanders were prepared.  These were Allergan tissue expanders  400 mL, 133 MV-13, lot number [id number] on the right and lot number  [id number] on the left, 150 mL of sterile saline was placed in the tissue  expander on closed filling system on the left side and 100 mL on the  right.  Because of the mastectomy on the right involved removal of a  little bit more skin than had been taken on the left.  The expanders  were soaked in antibiotic solution for greater than 5 minutes and  antibiotic solution was allowed to dwell in the submuscular space.  The  tissue expanders were positioned.  The muscle closure with 3-0 Vicryl  interrupted figure-of-eight sutures taking great care to avoid damage to  underlying tissue expanders, good submuscular coverage was obtained  bilaterally, and the skin closure with 3-0 Monocryl introverted deep  dermal  sutures, running 3-0 Monocryl subcuticular suture.  Steri-Strips, dry  sterile dressing, circumferential Ace wrap applied.  She was transported  to the recovery room stable having tolerated procedure well and she will  be observed overnight.      Etter Sjogren, M.D.  Electronically Signed     DB/MEDQ  D:  08/28/2008  T:  08/29/2008  Job:  [id number]

## 2010-08-01 ENCOUNTER — Encounter: Payer: Self-pay | Admitting: Internal Medicine

## 2010-10-02 ENCOUNTER — Other Ambulatory Visit: Payer: Self-pay | Admitting: Oncology

## 2010-10-02 ENCOUNTER — Encounter (HOSPITAL_BASED_OUTPATIENT_CLINIC_OR_DEPARTMENT_OTHER): Payer: 59 | Admitting: Oncology

## 2010-10-02 DIAGNOSIS — Z8571 Personal history of Hodgkin lymphoma: Secondary | ICD-10-CM

## 2010-10-02 DIAGNOSIS — C50419 Malignant neoplasm of upper-outer quadrant of unspecified female breast: Secondary | ICD-10-CM

## 2010-10-02 LAB — COMPREHENSIVE METABOLIC PANEL
ALT: 14 U/L (ref 0–35)
AST: 16 U/L (ref 0–37)
Alkaline Phosphatase: 130 U/L — ABNORMAL HIGH (ref 39–117)
Calcium: 10.3 mg/dL (ref 8.4–10.5)
Chloride: 101 mEq/L (ref 96–112)
Creatinine, Ser: 0.69 mg/dL (ref 0.50–1.10)
Total Bilirubin: 0.5 mg/dL (ref 0.3–1.2)

## 2010-10-02 LAB — CBC WITH DIFFERENTIAL/PLATELET
BASO%: 0.5 % (ref 0.0–2.0)
EOS%: 2.4 % (ref 0.0–7.0)
HCT: 39.5 % (ref 34.8–46.6)
MCH: 30.5 pg (ref 25.1–34.0)
MCHC: 33.7 g/dL (ref 31.5–36.0)
NEUT%: 69.2 % (ref 38.4–76.8)
lymph#: 1 10*3/uL (ref 0.9–3.3)

## 2010-10-14 ENCOUNTER — Encounter (HOSPITAL_BASED_OUTPATIENT_CLINIC_OR_DEPARTMENT_OTHER): Payer: 59 | Admitting: Oncology

## 2010-10-14 DIAGNOSIS — C50419 Malignant neoplasm of upper-outer quadrant of unspecified female breast: Secondary | ICD-10-CM

## 2010-10-14 DIAGNOSIS — Z8571 Personal history of Hodgkin lymphoma: Secondary | ICD-10-CM

## 2010-12-01 ENCOUNTER — Other Ambulatory Visit: Payer: Self-pay | Admitting: Oncology

## 2010-12-01 ENCOUNTER — Encounter (HOSPITAL_BASED_OUTPATIENT_CLINIC_OR_DEPARTMENT_OTHER): Payer: 59 | Admitting: Oncology

## 2010-12-01 DIAGNOSIS — C50419 Malignant neoplasm of upper-outer quadrant of unspecified female breast: Secondary | ICD-10-CM

## 2010-12-01 DIAGNOSIS — D0590 Unspecified type of carcinoma in situ of unspecified breast: Secondary | ICD-10-CM

## 2010-12-01 DIAGNOSIS — Z8571 Personal history of Hodgkin lymphoma: Secondary | ICD-10-CM

## 2010-12-01 LAB — CBC WITH DIFFERENTIAL/PLATELET
BASO%: 0.5 % (ref 0.0–2.0)
Basophils Absolute: 0 10*3/uL (ref 0.0–0.1)
Eosinophils Absolute: 0.1 10*3/uL (ref 0.0–0.5)
HCT: 39.5 % (ref 34.8–46.6)
HGB: 13.6 g/dL (ref 11.6–15.9)
LYMPH%: 17.6 % (ref 14.0–49.7)
MCHC: 34.3 g/dL (ref 31.5–36.0)
MONO#: 0.4 10*3/uL (ref 0.1–0.9)
NEUT%: 72.3 % (ref 38.4–76.8)
Platelets: 268 10*3/uL (ref 145–400)
WBC: 5.6 10*3/uL (ref 3.9–10.3)
lymph#: 1 10*3/uL (ref 0.9–3.3)

## 2010-12-01 LAB — PROTHROMBIN TIME
INR: 0.96 (ref <1.50)
Prothrombin Time: 13 seconds (ref 11.6–15.2)

## 2010-12-01 LAB — COMPREHENSIVE METABOLIC PANEL
BUN: 23 mg/dL (ref 6–23)
CO2: 28 mEq/L (ref 19–32)
Calcium: 9.9 mg/dL (ref 8.4–10.5)
Chloride: 101 mEq/L (ref 96–112)
Creatinine, Ser: 0.58 mg/dL (ref 0.50–1.10)
Total Bilirubin: 0.2 mg/dL — ABNORMAL LOW (ref 0.3–1.2)

## 2010-12-01 LAB — APTT: aPTT: 32 seconds (ref 24–37)

## 2011-03-27 ENCOUNTER — Telehealth: Payer: Self-pay | Admitting: Oncology

## 2011-03-27 NOTE — Telephone Encounter (Signed)
Called pt,left message for 04/16/11 lab and MD ob 04/23/11 11am

## 2011-04-16 ENCOUNTER — Other Ambulatory Visit: Payer: 59 | Admitting: Lab

## 2011-04-23 ENCOUNTER — Ambulatory Visit (HOSPITAL_BASED_OUTPATIENT_CLINIC_OR_DEPARTMENT_OTHER): Payer: 59 | Admitting: Oncology

## 2011-04-23 ENCOUNTER — Telehealth: Payer: Self-pay | Admitting: *Deleted

## 2011-04-23 ENCOUNTER — Other Ambulatory Visit (HOSPITAL_BASED_OUTPATIENT_CLINIC_OR_DEPARTMENT_OTHER): Payer: 59 | Admitting: Lab

## 2011-04-23 VITALS — BP 129/84 | HR 69 | Temp 98.3°F | Ht 68.0 in | Wt 198.9 lb

## 2011-04-23 DIAGNOSIS — Z8571 Personal history of Hodgkin lymphoma: Secondary | ICD-10-CM

## 2011-04-23 DIAGNOSIS — C50919 Malignant neoplasm of unspecified site of unspecified female breast: Secondary | ICD-10-CM

## 2011-04-23 LAB — CBC WITH DIFFERENTIAL/PLATELET
BASO%: 0.4 % (ref 0.0–2.0)
LYMPH%: 24.6 % (ref 14.0–49.7)
MCHC: 34.3 g/dL (ref 31.5–36.0)
MONO#: 0.3 10*3/uL (ref 0.1–0.9)
RBC: 4.44 10*6/uL (ref 3.70–5.45)
RDW: 12.4 % (ref 11.2–14.5)
WBC: 4 10*3/uL (ref 3.9–10.3)
lymph#: 1 10*3/uL (ref 0.9–3.3)

## 2011-04-23 LAB — COMPREHENSIVE METABOLIC PANEL
ALT: 17 U/L (ref 0–35)
Albumin: 4.5 g/dL (ref 3.5–5.2)
CO2: 26 mEq/L (ref 19–32)
Chloride: 103 mEq/L (ref 96–112)
Potassium: 4 mEq/L (ref 3.5–5.3)
Sodium: 139 mEq/L (ref 135–145)
Total Bilirubin: 0.5 mg/dL (ref 0.3–1.2)
Total Protein: 6.8 g/dL (ref 6.0–8.3)

## 2011-04-23 NOTE — Telephone Encounter (Signed)
gave patient appointment for ultrasound of the left breast on 05-01-2011 at 9:45am at [facility] gave patient appointment for 10-2011 to come back and see the dr. printed out calendar and gave to the patient

## 2011-04-25 DIAGNOSIS — C50919 Malignant neoplasm of unspecified site of unspecified female breast: Secondary | ICD-10-CM | POA: Insufficient documentation

## 2011-04-25 NOTE — Progress Notes (Signed)
ID: Denise [facility]   DOB: 1960/03/26  MR#: [id number]  WUJ#:[id number]  HISTORY OF PRESENT ILLNESS: The patient had routine screening mammography Jul 11, 2008.  This showed dense breasts with benign-appearing calcifications, but a possible small mass on distortion in the upper outer quadrant of the right breast.  The patient was recalled for further studies on May 12 and this showed a 2 cm spiculated mass in the right breast, with ultrasound showing this to be hypoechoic, irregular and measuring 1.8 cm.  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    With this information, the patient was referred to Dr Carolynne Edouard and bilateral breast MRI was obtained on June 2.  This showed, in addition to extremely dense breasts, a 2.4 cm oval mass in the upper outer right breast, with no other masses or areas of abnormal enhancement in either breast.  There were no abnormal-appearing lymph nodes.    The patient proceeded to bilateral mastectomies (since she had mantle irradiation previously for Hodgkin disease) August 28, 2008.  The final pathology ([id number]) showed on the right the 2.2 cm invasive ductal carcinoma previously biopsied, grade 2, with negative margins, and 0 of 5 sentinel lymph nodes were involved.  On the left, there was an unsuspected focus of high-grade ductal carcinoma in situ measuring 1.3 cm and focally involving the deep margin; 5 sentinel lymph nodes were removed from the left axilla and they were all negative.  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  The patient's adjuvant treatment is summarized below.  INTERVAL HISTORY: Denise Hull's interval history is complex. In September 2012, she brought to her dermatologist attention a mass she had  noted in the inferior part of her left breast. Punch biopsy was read as suggestive of a low-grade sarcoma [phone], [facility]). She saw Denise [facility] at [facility] and he obtain a chest CT which was essentially negative. Surgery was planned today her, but she sought a second opinion at [facility] under [facility]ise [facility]. [facility] and [facility]. Pathology review there suggested a benign hemangioma but in any case complete excision was performed Sept 2012. I do not have the final pathology report but Denise Hull understands "it was fine" and in fact as far as she knows no further follow-up there is planned.  REVIEW OF SYSTEMS: This is generally benign. She feels a little forgetful. She is having moderate hot flashes, which bother her particularly at in the evening. They don't wake her up. She continues to have close followup of her skin through [facility]. Otherwise a detailed review of systems was noncontributory  PAST MEDICAL HISTORY: 1. 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  She underwent mantle radiation.  Did not undergo splenectomy.  The patient tells me she does not have hypothyroidism to the best of her knowledge. 2. The patient has had multiple basal cells, I believe all of them (from her description) were in the radiation field.  She is followed by [facility] for this. 3. There is a history of endometrial bleeding secondary to polyps and fibroids.  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  The patient's mother is alive.  The patient has 3 brothers and 1 sister.  There is  no history of breast cancer in the immediate family, although the patient's father had 2 sisters and both were diagnosed with breast cancer in their 36s.  There is no history of ovarian cancer in the family.  GYNECOLOGIC HISTORY: She is GX, P1.  She delivered age 17,  just before she was diagnosed with Hodgkin disease in 1. Her periods always were  Irregular and heavy; she was on oral contraceptives to regulate this. That was discontinued at the time of her breast cancer diagnosis. 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  Her husband Denise [facility] is a Best boy for a [employer].  Their daughter Denise Hull is currently studying psychology at [facility].  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  The patient attends [facility]. [employer].   ADVANCED DIRECTIVES: in place  HEALTH MAINTENANCE: History  Substance Use Topics  . Smoking status: Not on file  . Smokeless tobacco: Not on file  . Alcohol Use: Not on file     Colonoscopy: May 2012 [facility])  PAP: May 2012  Bone density: [facility] April 2011; T -1.1 R fem neck  Lipid panel:  TSH:  Allergies  Allergen Reactions  . Aspirin     REACTION: Nosebleeds  . Sulfonamide Derivatives     Current Outpatient Prescriptions  Medication Sig Dispense Refill  . anastrozole (ARIMIDEX) 1 MG tablet       . gabapentin (NEURONTIN) 300 MG capsule         OBJECTIVE: Filed Vitals:   04/23/11 1048  BP: 129/84  Pulse: 69  Temp: 98.3 F (36.8 [facility])     Body mass index is 30.24 kg/(m^2).    ECOG FS: 0  Sclerae unicteric Oropharynx clear No peripheral adenopathy and in particular both axillae are benign Lungs no rales or rhonchi Heart regular rate and rhythm Abd benign MSK no focal spinal tenderness, no peripheral edema Neuro: nonfocal Breasts: Status post bilateral  mastectomies with bilateral implants in place. There is no evidence of local recurrence on the right. On the left in the inferior mammary fold there is a 3 cm slightly reddish and raised scar. There is no subjacent induration. There is no surrounding erythema or other skin changes  LAB RESULTS: Lab Results  Component Value Date   WBC 4.0 04/23/2011   NEUTROABS 2.5 04/23/2011   HGB 13.8 04/23/2011   HCT 40.1 04/23/2011   MCV 90.2 04/23/2011   PLT 257 04/23/2011      Chemistry      Component Value Date/Time   NA 139 04/23/2011 1029   K 4.0 04/23/2011 1029   CL 103 04/23/2011 1029   CO2 26 04/23/2011 1029   BUN 18 04/23/2011 1029   CREATININE 0.81 04/23/2011 1029      Component Value Date/Time   CALCIUM 9.9 04/23/2011 1029   ALKPHOS 101 04/23/2011 1029   AST 18 04/23/2011 1029   ALT 17 04/23/2011 1029   BILITOT 0.5 04/23/2011 1029       No results found for this  basename: INR:1;PROTIME:1 in the last 168 hours  No results found for this basename: UACOL:1,UAPR:1,USPG:1,UPH:1,UTP:1,UGL:1,UKET:1,UBIL:1,UHGB:1,UNIT:1,UROB:1,ULEU:1,UEPI:1,UWBC:1,URBC:1,UBAC:1,CAST:1,CRYS:1,UCOM:1,BILUA:1 in the last 72 hours   STUDIES: No new results found. A 3 mm right lower lobe nodule was noted on chest CT here July 2010. I do not know if this was compared with the CT scan she obtained at [facility] September 2012.  ASSESSMENT: BRCA 1-2 negative [place] woman with a history of  1.  Stage II nodular sclerosing Hodgkin's disease diagnosed in 1991 and treated with mantle radiation alone (no splenectomy).  She completed all treatments November 1991.  There is no evidence of disease recurrence or hypothyroidism to date.  2.  Status post bilateral mastectomies May 2010 for a right sided T2 N0, Stage IIA invasive ductal carcinoma, grade 2 , estrogen and progesterone receptor positive, HER2/neu negative, with an MIB-1 of 38%.  She was treated with cyclophosphamide and docetaxel times six followed by radiation  completed in March 2011.  She was started on anastrozole at that time.  3. 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  PLAN: We will request the final pathology from her left breast resection at [facility] last year, and also the films from the CT scan obtained at [facility] around the same time. As far as followup of the lesion in the left breast, we will set her up for left breast ultrasonography in the near future and will repeat that in 6 months. She will continue on anastrozole for a total of 5 years and will need a repeat bone density before her return visit here in August. We will continue to monitor her thyroid function as well. She knows to call for any problems that may develop before the next visit   [facility]    04/25/2011

## 2011-04-30 ENCOUNTER — Other Ambulatory Visit: Payer: Self-pay | Admitting: Oncology

## 2011-10-14 ENCOUNTER — Other Ambulatory Visit: Payer: 59 | Admitting: Lab

## 2011-10-21 ENCOUNTER — Ambulatory Visit: Payer: 59 | Admitting: Oncology

## 2011-10-25 ENCOUNTER — Other Ambulatory Visit: Payer: Self-pay | Admitting: Physician Assistant

## 2011-10-25 DIAGNOSIS — C50919 Malignant neoplasm of unspecified site of unspecified female breast: Secondary | ICD-10-CM

## 2011-11-24 ENCOUNTER — Telehealth: Payer: Self-pay | Admitting: *Deleted

## 2011-11-24 ENCOUNTER — Other Ambulatory Visit (HOSPITAL_BASED_OUTPATIENT_CLINIC_OR_DEPARTMENT_OTHER): Payer: 59 | Admitting: Lab

## 2011-11-24 ENCOUNTER — Ambulatory Visit (HOSPITAL_BASED_OUTPATIENT_CLINIC_OR_DEPARTMENT_OTHER): Payer: 59 | Admitting: Oncology

## 2011-11-24 VITALS — BP 128/82 | HR 79 | Temp 98.3°F | Resp 20 | Ht 68.0 in | Wt 196.9 lb

## 2011-11-24 DIAGNOSIS — C50419 Malignant neoplasm of upper-outer quadrant of unspecified female breast: Secondary | ICD-10-CM

## 2011-11-24 DIAGNOSIS — C50919 Malignant neoplasm of unspecified site of unspecified female breast: Secondary | ICD-10-CM

## 2011-11-24 DIAGNOSIS — Z8571 Personal history of Hodgkin lymphoma: Secondary | ICD-10-CM

## 2011-11-24 LAB — COMPREHENSIVE METABOLIC PANEL (CC13)
ALT: 23 U/L (ref 0–55)
AST: 20 U/L (ref 5–34)
Albumin: 4 g/dL (ref 3.5–5.0)
Alkaline Phosphatase: 114 U/L (ref 40–150)
BUN: 17 mg/dL (ref 7.0–26.0)
Calcium: 10 mg/dL (ref 8.4–10.4)
Chloride: 106 mEq/L (ref 98–107)
Potassium: 3.9 mEq/L (ref 3.5–5.1)
Sodium: 141 mEq/L (ref 136–145)
Total Protein: 6.8 g/dL (ref 6.4–8.3)

## 2011-11-24 LAB — CBC WITH DIFFERENTIAL/PLATELET
BASO%: 0.6 % (ref 0.0–2.0)
LYMPH%: 21.6 % (ref 14.0–49.7)
MCHC: 34.3 g/dL (ref 31.5–36.0)
MCV: 88.4 fL (ref 79.5–101.0)
MONO#: 0.4 10*3/uL (ref 0.1–0.9)
MONO%: 8.8 % (ref 0.0–14.0)
Platelets: 251 10*3/uL (ref 145–400)
RBC: 4.75 10*6/uL (ref 3.70–5.45)
RDW: 13.1 % (ref 11.2–14.5)
WBC: 5 10*3/uL (ref 3.9–10.3)
nRBC: 0 % (ref 0–0)

## 2011-11-24 NOTE — Progress Notes (Signed)
ID: [facility]ise [facility]   DOB: 04-23-1960  MR#: [id number]  NWG#:[id number]  HISTORY OF PRESENT ILLNESS: The patient had routine screening mammography Jul 11, 2008.  This showed dense breasts with benign-appearing calcifications, but a possible small mass on distortion in the upper outer quadrant of the right breast.  The patient was recalled for further studies on May 12 and this showed a 2 cm spiculated mass in the right breast, with ultrasound showing this to be hypoechoic, irregular and measuring 1.8 cm.  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    With this information, the patient was referred to Dr Carolynne Edouard and bilateral breast MRI was obtained on June 2.  This showed, in addition to extremely dense breasts, a 2.4 cm oval mass in the upper outer right breast, with no other masses or areas of abnormal enhancement in either breast.  There were no abnormal-appearing lymph nodes.    The patient proceeded to bilateral mastectomies (since she had mantle irradiation previously for Hodgkin disease) August 28, 2008.  The final pathology ([id number]) showed on the right the 2.2 cm invasive ductal carcinoma previously biopsied, grade 2, with negative margins, and 0 of 5 sentinel lymph nodes were involved.  On the left, there was an unsuspected focus of high-grade ductal carcinoma in situ measuring 1.3 cm and focally involving the deep margin; 5 sentinel lymph nodes were removed from the left axilla and they were all negative.  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  The patient's adjuvant treatment is summarized below.  INTERVAL HISTORY: [facility] returns for routine followup of her breast cancer. Interval history is otherwise noncontributory. She is  planning a trip to the beach with her husband [name] starting tomorrow. She tells me one of the positives from all her medical issues has been how close they're marriage has become, which is a wonderful thing to hear.   REVIEW OF SYSTEMS: She feels a little forgetful at times. For example she will forget a name for 30 or 40 seconds before he "comes". The middle of her chest sometimes feels a little tight. This is clearly a muscular feeling which she can relieve by either scratching her of shoulders or the opposite, doing a little bit of extension. She understands that she exercises regularly this would feel a bit better. She dreads coming here and having lab work drawn, but she has found that one of our phlebotomist's, Clinton Gallant, is particularly good at getting her blood, so she is requesting her for the next time. Otherwise a detailed review of systems is negative, and she is tolerating the anastrozole with no obvious side effects that she is aware of.  PAST MEDICAL HISTORY: 1. 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  She underwent mantle radiation.  Did not undergo splenectomy.  The patient tells me she does not have hypothyroidism to the best of her knowledge. 2. The patient has had multiple basal cells, I believe all of them (from her description) were in the radiation field.  She is followed by Elmon Else for this. 3. There is a history of endometrial bleeding secondary to polyps and fibroids.  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  The patient's mother is alive.  The  patient has 3 brothers and 1 sister.  There is no history of breast cancer in the immediate family, although the patient's father had 2 sisters and both were diagnosed with breast cancer in their 52s.  There is no history of ovarian cancer in the family.  GYNECOLOGIC HISTORY: She is GX, P1.  She delivered age 17,  just before she was diagnosed with Hodgkin disease in 38. Her periods always were  Irregular and heavy; she was on oral contraceptives to regulate this. That was discontinued at the time of her breast cancer diagnosis. 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  Her husband [name] is a Best boy for a [employer].  Their daughter [name] is currently studying psychology at [employer].  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  The patient attends [place]. [employer].   ADVANCED DIRECTIVES: in place  HEALTH MAINTENANCE: History  Substance Use Topics  . Smoking status: Not on file  . Smokeless tobacco: Not on file  . Alcohol Use: Not on file     Colonoscopy: May 2012 [address])  PAP: May 2012  Bone density: SOLIS April 2011; T -1.1 R fem neck  Lipid panel:  TSH:  Allergies  Allergen Reactions  . Aspirin     REACTION: Nosebleeds  . Sulfonamide Derivatives     Current Outpatient Prescriptions  Medication Sig Dispense Refill  . anastrozole (ARIMIDEX) 1 MG tablet TAKE ONE TABLET BY MOUTH ONE TIME DAILY  30 tablet  2  . gabapentin (NEURONTIN) 300 MG capsule TAKE ONE CAPSULE BY MOUTH AT BEDTIME  30 capsule  2    OBJECTIVE: Middle-aged white woman who appears well Filed Vitals:   11/24/11 1005  BP: 128/82  Pulse: 79  Temp: 98.3 F (36.8 C)  Resp: 20     Body mass index is 29.94 kg/(m^2).    ECOG FS: 0  Sclerae unicteric Oropharynx clear No peripheral adenopathy and in particular both  axillae are benign Lungs no rales or rhonchi Heart regular rate and rhythm Abd benign MSK no focal spinal tenderness, no peripheral edema Neuro: nonfocal Breasts: Status post bilateral mastectomies with bilateral implants in place. There is no evidence of local recurrence on the right. On the left in the inferior mammary fold there is a 3 cm slightly reddish and raised scar. There is unchanged from prior  LAB RESULTS: Lab Results  Component Value Date   WBC 5.0 11/24/2011   NEUTROABS 3.2 11/24/2011   HGB 14.4 11/24/2011   HCT 42.0 11/24/2011   MCV 88.4 11/24/2011   PLT 251 11/24/2011      Chemistry      Component Value Date/Time   NA 139 04/23/2011 1029   K 4.0 04/23/2011 1029   CL 103 04/23/2011 1029   CO2 26 04/23/2011 1029   BUN 18 04/23/2011 1029   CREATININE 0.81 04/23/2011 1029      Component Value Date/Time   CALCIUM 9.9 04/23/2011 1029   ALKPHOS 101 04/23/2011 1029  AST 18 04/23/2011 1029   ALT 17 04/23/2011 1029   BILITOT 0.5 04/23/2011 1029       No results found for this basename: INR:1;PROTIME:1 in the last 168 hours  No results found for this basename: UACOL:1,UAPR:1,USPG:1,UPH:1,UTP:1,UGL:1,UKET:1,UBIL:1,UHGB:1,UNIT:1,UROB:1,ULEU:1,UEPI:1,UWBC:1,URBC:1,UBAC:1,CAST:1,CRYS:1,UCOM:1,BILUA:1 in the last 72 hours   STUDIES: Bone density obtained February 2013, shows a T score of -1.4.  ASSESSMENT: BRCA 1-2 negative [place] woman with a history of  1.  Stage II nodular sclerosing Hodgkin's disease diagnosed in 1991 and treated with mantle radiation alone (no splenectomy).  She completed all treatments November 1991.  There is no evidence of disease recurrence or hypothyroidism to date.  2.  Status post bilateral mastectomies May 2010 for a right sided T2 N0, Stage IIA invasive ductal carcinoma, grade 2 , estrogen and progesterone receptor positive, HER2/neu negative, with an MIB-1 of 38%.  She was treated with cyclophosphamide and docetaxel times six followed by  radiation completed in March 2011.  She was started on anastrozole at that time.  3. 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    PLAN: Dainelle is doing terrific from a breast cancer point of view. I encouraged her to do a bit more walking, and to take calcium and vitamin D supplementation, but otherwise we're simply continuing the anastrozole to a total of 5 years. She will see [place] again in 6 months. She knows to call for any problems that may develop before then.   Vannie Hochstetler C    11/24/2011

## 2011-11-24 NOTE — Telephone Encounter (Signed)
Gave patient appointment for 05-24-2012 starting at 9:30am with labs and md

## 2012-02-07 ENCOUNTER — Other Ambulatory Visit: Payer: Self-pay | Admitting: Oncology

## 2012-02-07 DIAGNOSIS — C50919 Malignant neoplasm of unspecified site of unspecified female breast: Secondary | ICD-10-CM

## 2012-05-24 ENCOUNTER — Telehealth: Payer: Self-pay | Admitting: Oncology

## 2012-05-24 ENCOUNTER — Other Ambulatory Visit (HOSPITAL_BASED_OUTPATIENT_CLINIC_OR_DEPARTMENT_OTHER): Payer: 59 | Admitting: Lab

## 2012-05-24 ENCOUNTER — Other Ambulatory Visit: Payer: Self-pay | Admitting: *Deleted

## 2012-05-24 ENCOUNTER — Ambulatory Visit (HOSPITAL_BASED_OUTPATIENT_CLINIC_OR_DEPARTMENT_OTHER): Payer: 59 | Admitting: Oncology

## 2012-05-24 ENCOUNTER — Ambulatory Visit (HOSPITAL_BASED_OUTPATIENT_CLINIC_OR_DEPARTMENT_OTHER): Payer: 59 | Admitting: Lab

## 2012-05-24 VITALS — BP 119/76 | HR 91 | Temp 98.1°F | Resp 20 | Ht 68.0 in | Wt 192.9 lb

## 2012-05-24 DIAGNOSIS — Z8571 Personal history of Hodgkin lymphoma: Secondary | ICD-10-CM

## 2012-05-24 DIAGNOSIS — C50919 Malignant neoplasm of unspecified site of unspecified female breast: Secondary | ICD-10-CM

## 2012-05-24 DIAGNOSIS — C50419 Malignant neoplasm of upper-outer quadrant of unspecified female breast: Secondary | ICD-10-CM

## 2012-05-24 DIAGNOSIS — Z170 Estrogen receptor positive status [ER+]: Secondary | ICD-10-CM

## 2012-05-24 LAB — CBC WITH DIFFERENTIAL/PLATELET
Basophils Absolute: 0.1 10*3/uL (ref 0.0–0.1)
EOS%: 4 % (ref 0.0–7.0)
Eosinophils Absolute: 0.2 10*3/uL (ref 0.0–0.5)
HGB: 14 g/dL (ref 11.6–15.9)
LYMPH%: 19.9 % (ref 14.0–49.7)
MCH: 30.5 pg (ref 25.1–34.0)
MCV: 89.3 fL (ref 79.5–101.0)
MONO%: 8.1 % (ref 0.0–14.0)
NEUT#: 3.5 10*3/uL (ref 1.5–6.5)
Platelets: 255 10*3/uL (ref 145–400)
RBC: 4.59 10*6/uL (ref 3.70–5.45)
WBC: 5.2 10*3/uL (ref 3.9–10.3)

## 2012-05-24 LAB — COMPREHENSIVE METABOLIC PANEL (CC13)
Alkaline Phosphatase: 101 U/L (ref 40–150)
BUN: 20.7 mg/dL (ref 7.0–26.0)
Glucose: 76 mg/dl (ref 70–99)
Total Bilirubin: 0.49 mg/dL (ref 0.20–1.20)

## 2012-05-24 NOTE — Progress Notes (Signed)
ID: Denise Hull   DOB: 1960-10-10  MR#: [id number]  MWU#:[id number]  PCP: GYN: M. Leda Quail SU: OTHER MD:  HISTORY OF PRESENT ILLNESS: The patient had routine screening mammography Jul 11, 2008.  This showed dense breasts with benign-appearing calcifications, but a possible small mass on distortion in the upper outer quadrant of the right breast.  The patient was recalled for further studies on May 12 and this showed a 2 cm spiculated mass in the right breast, with ultrasound showing this to be hypoechoic, irregular and measuring 1.8 cm.  Ultrasound-guided biopsy was performed on May 19 and showed ([id number] and [id number]) an invasive ductal carcinoma which was ER 100% positive, PR 67% positive, with a proliferation marker of 38% and HER-2 not amplified by CISH, with a ratio of 1.41.    With this information, the patient was referred to Dr Carolynne Edouard and bilateral breast MRI was obtained on June 2.  This showed, in addition to extremely dense breasts, a 2.4 cm oval mass in the upper outer right breast, with no other masses or areas of abnormal enhancement in either breast.  There were no abnormal-appearing lymph nodes.    The patient proceeded to bilateral mastectomies (since she had mantle irradiation previously for Hodgkin disease) August 28, 2008.  The final pathology ([id number]) showed on the right the 2.2 cm invasive ductal carcinoma previously biopsied, grade 2, with negative margins, and 0 of 5 sentinel lymph nodes were involved.  On the left, there was an unsuspected focus of high-grade ductal carcinoma in situ measuring 1.3 cm and focally involving the deep margin; 5 sentinel lymph nodes were removed from the left axilla and they were all negative.  A prognostic panel for this DCIS ([id number]) showed the left-sided DCIS to be 89% ER and 55% PR positive.  The patient's adjuvant treatment is summarized below.  INTERVAL HISTORY: Denise Hull returns for routine followup of her breast cancer and history of  breast sarcoma. The interval history is unremarkable. She is "healthy". She is worried about her husband, Denise Hull, who has been found to have elevated liver function tests and an elevated red cell mass. He is scheduled to meet with our partner Dr. Gaylyn Rong later this month. On the plus side, her daughter is getting married June 21.  REVIEW OF SYSTEMS: She is tolerating the anastrozole with minimal side effects. Specifically she has mild hot flashes and some vaginal dryness for which she uses non-estrogenic lubricants. Sometimes she has an irregular heartbeat. She has had this in the past. She is drinking quite a bit of coffee and died coconut have advised her to discontinue that and then let us know if the palpitations persist so we can refer her to like her physiology. A detailed review of systems today is otherwise entirely negative.  PAST MEDICAL HISTORY: 1. 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  She underwent mantle radiation.  Did not undergo splenectomy.  The patient tells me she does not have hypothyroidism to the best of her knowledge. 2. The patient has had multiple basal cells, I believe all of them (from her description) were in the radiation field.  She is followed by Elmon Else for this. 3. There is a history of endometrial bleeding secondary to polyps and fibroids.  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  The patient's mother is alive.  The patient has 3 brothers and 1 sister.  There is no history of breast cancer in the immediate family, although the patient's father had 2  sisters and both were diagnosed with breast cancer in their 42s.  There is no history of ovarian cancer in the family.  GYNECOLOGIC HISTORY: She is GX, P1.  She delivered age 54,  just before she was diagnosed with Hodgkin disease in 54. Her periods always were  Irregular and heavy; she was on oral contraceptives to regulate this. That was discontinued at the time of her breast cancer diagnosis. She underwent bilateral oophorectomy age 66 (no hysterectomy)  SOCIAL HISTORY: She works in Chief Technology Officer.  The job is very physical and she has to move 40-pound bales of cloth sometimes to locations above her head.  Her husband Denise Hull is a Best boy for a [employer].  Their daughter Denise Hull is currently studying psychology at [employer].  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  The patient attends [place]. [employer].   ADVANCED DIRECTIVES: in place  HEALTH MAINTENANCE: History  Substance Use Topics  . Smoking status: Not on file  . Smokeless tobacco: Not on file  . Alcohol Use: Not on file     Colonoscopy: May 2012 [address])  PAP: May 2012  Bone density: SOLIS April 2011; T -1.1 R fem neck  Lipid panel:  TSH:  Allergies  Allergen Reactions  . Aspirin     REACTION: Nosebleeds  . Sulfonamide Derivatives     Current Outpatient Prescriptions  Medication Sig Dispense Refill  . anastrozole (ARIMIDEX) 1 MG tablet TAKE ONE TABLET BY MOUTH ONE TIME DAILY  30 tablet  4  . gabapentin (NEURONTIN) 300 MG capsule TAKE ONE CAPSULE BY MOUTH NIGHTLY AT BEDTIME  30 capsule  4   No current facility-administered medications for this visit.    OBJECTIVE: Middle-aged white woman who appears well Filed Vitals:   05/24/12 0941  BP: 119/76  Pulse: 91  Temp: 98.1 F (36.7 C)  Resp: 20     Body mass index is 29.34 kg/(m^2).    ECOG FS: 0  Sclerae unicteric Oropharynx clear No peripheral adenopathy and in particular both axillae are benign Lungs no rales or rhonchi Heart regular rate and  rhythm Abd benign MSK no focal spinal tenderness, no peripheral edema Neuro: nonfocal Breasts: Status post bilateral mastectomies with bilateral implants in place. There is no evidence of local recurrence on the right. On the left in the inferior mammary fold there is a 3 cm  scar. There is  no suggestion of local recurrence. Both axillae are benign  LAB RESULTS: Lab Results  Component Value Date   WBC 5.0 11/24/2011   NEUTROABS 3.2 11/24/2011   HGB 14.4 11/24/2011   HCT 42.0 11/24/2011   MCV 88.4 11/24/2011   PLT 251 11/24/2011      Chemistry      Component Value Date/Time   NA 141 11/24/2011 0942   NA 139 04/23/2011 1029   K 3.9 11/24/2011 0942   K 4.0 04/23/2011 1029   CL 106 11/24/2011 0942   CL 103 04/23/2011 1029   CO2 23 11/24/2011 0942   CO2 26 04/23/2011 1029   BUN 17.0 11/24/2011 0942   BUN 18 04/23/2011 1029   CREATININE 0.7 11/24/2011 8295  CREATININE 0.81 04/23/2011 1029      Component Value Date/Time   CALCIUM 10.0 11/24/2011 0942   CALCIUM 9.9 04/23/2011 1029   ALKPHOS 114 11/24/2011 0942   ALKPHOS 101 04/23/2011 1029   AST 20 11/24/2011 0942   AST 18 04/23/2011 1029   ALT 23 11/24/2011 0942   ALT 17 04/23/2011 1029   BILITOT 0.80 11/24/2011 0942   BILITOT 0.5 04/23/2011 1029       No results found for this basename: INR,  in the last 168 hours  No results found for this basename: UACOL, UAPR, USPG, UPH, UTP, UGL, UKET, UBIL, UHGB, UNIT, UROB, ULEU, UEPI, UWBC, URBC, UBAC, CAST, CRYS, UCOM, BILUA,  in the last 72 hours   STUDIES: Bone density obtained 05/18/ 2013, shows a T score of -1.4.  ASSESSMENT: 52 y.o. BRCA negative [place] woman with a history of  1.  Stage II nodular sclerosing Hodgkin's disease diagnosed in 1991 and treated with mantle radiation alone (no splenectomy).  She completed all treatments November 1991.  There is no evidence of disease recurrence or hypothyroidism to date.  2.  Status post bilateral mastectomies May 2010 for a right sided T2 N0,  Stage IIA invasive ductal carcinoma, grade 2 , estrogen and progesterone receptor positive, HER2/neu negative, with an MIB-1 of 38%.  She was treated with cyclophosphamide and docetaxel times six followed by radiation completed in March 2011.  She was started on anastrozole at that time.  3. 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    PLAN: There is no evidence of breast cancer recurrence or activity. There is no evidence of the low-grade angiosarcoma or hemangioma removed from the left inframammary fold recurring. Philippa has completed 3 of her 5 planned years of anastrozole. I am going to check a vitamin D level today, as was the TSH, together with routine labs. She will be seeing Dr. Hyacinth Meeker in the near future and we will make sure those labs are available to her. Otherwise Maryl will see me again in September. We will probably do only physical exam at that time, since she is a "difficult stick". She knows to call for any problems that may develop before that visit.  MAGRINAT,GUSTAV C    05/24/2012

## 2012-05-24 NOTE — Telephone Encounter (Signed)
Pt sent back to lb and given appt schedule for September.

## 2012-05-25 LAB — TSH: TSH: 2.179 u[IU]/mL (ref 0.350–4.500)

## 2012-07-07 ENCOUNTER — Other Ambulatory Visit: Payer: Self-pay | Admitting: *Deleted

## 2012-07-07 DIAGNOSIS — C50919 Malignant neoplasm of unspecified site of unspecified female breast: Secondary | ICD-10-CM

## 2012-07-07 MED ORDER — GABAPENTIN 300 MG PO CAPS: ORAL_CAPSULE | ORAL | Status: DC

## 2012-07-07 MED ORDER — ANASTROZOLE 1 MG PO TABS: ORAL_TABLET | ORAL | Status: DC

## 2012-08-30 ENCOUNTER — Encounter: Payer: Self-pay | Admitting: Obstetrics & Gynecology

## 2012-08-31 ENCOUNTER — Ambulatory Visit: Payer: Self-pay | Admitting: Obstetrics & Gynecology

## 2012-09-02 ENCOUNTER — Ambulatory Visit (INDEPENDENT_AMBULATORY_CARE_PROVIDER_SITE_OTHER): Payer: 59 | Admitting: Obstetrics & Gynecology

## 2012-09-02 ENCOUNTER — Encounter: Payer: Self-pay | Admitting: Obstetrics & Gynecology

## 2012-09-02 VITALS — BP 140/82 | HR 64 | Resp 16 | Ht 67.75 in | Wt 185.6 lb

## 2012-09-02 DIAGNOSIS — E559 Vitamin D deficiency, unspecified: Secondary | ICD-10-CM

## 2012-09-02 DIAGNOSIS — Z0000 Encounter for general adult medical examination without abnormal findings: Secondary | ICD-10-CM

## 2012-09-02 DIAGNOSIS — Z01419 Encounter for gynecological examination (general) (routine) without abnormal findings: Secondary | ICD-10-CM

## 2012-09-02 LAB — POCT URINALYSIS DIPSTICK
Bilirubin, UA: NEGATIVE
Glucose, UA: NEGATIVE
Leukocytes, UA: NEGATIVE
Nitrite, UA: NEGATIVE

## 2012-09-02 MED ORDER — ERGOCALCIFEROL 1.25 MG (50000 UT) PO CAPS: 50000.0000 [IU] | ORAL_CAPSULE | ORAL | Status: DC

## 2012-09-02 NOTE — Progress Notes (Signed)
52 y.o. G1P1001 MarriedCaucasianF here for annual exam.  Daughter just got married on Saturday.  Showed me pictures which are beautiful especially the flowers and dresses.  Daughter and son-in-law are local.  Daughter is now looking for teaching job.    No vaginal bleeding.  Had labs with Dr. Darnelle Catalan.  Vit D was 14.   Patient's last menstrual period was 10/07/2008.          Sexually active: yes  The current method of family planning is post menopausal status.    Exercising: no  not regularly Smoker:  no  Health Maintenance: Pap:  07/30/11 WNL/negative HR HPV History of abnormal Pap:  yes MMG:  2010-MMG, 05/01/11 breast us-will have MRI periodically Colonoscopy:  5/12 repeat in 5 years, Dr. [address] 3 polyps BMD:   08/04/11 mild osteopenia, -1.2/-1.4 TDaP:  2009 Screening Labs: Dr Darnelle Catalan, Hb today: Dr Darnelle Catalan, Urine today: negative   reports that she has never smoked. She does not have any smokeless tobacco history on file. She reports that she drinks about 1.5 ounces of alcohol per week. She reports that she does not use illicit drugs.  Past Medical History  Diagnosis Date  . GERD (gastroesophageal reflux disease)   . Invasive ductal carcinoma of breast 8/10    +ER/PR+  . Skin cancer   . Hodgkin disease     Past Surgical History  Procedure Laterality Date  . Tibia fracture surgery  12/11    Repair  . Lymph node biopsy    . Basal cell carcinoma excision      Removal  . Breast biopsy  5/10    Invasive ductal changes  . Breast lumpectomy Bilateral 5/10    - nodes  . Mastectomy Bilateral 08/28/08  . Laparoscopy  03/07/09    BSO, endo bx, implant placements    Current Outpatient Prescriptions  Medication Sig Dispense Refill  . anastrozole (ARIMIDEX) 1 MG tablet TAKE ONE TABLET BY MOUTH ONE TIME DAILY  30 tablet  5  . Calcium Carbonate (CALTRATE 600 PO) Take by mouth.      . Loratadine (CLARITIN PO) Take by mouth.      . Multiple Vitamins-Minerals (MULTIVITAMIN PO) Take by  mouth.       No current facility-administered medications for this visit.    Family History  Problem Relation Age of Onset  . Thyroid disease Mother   . Thyroid disease Father   . Hypertension Paternal Grandmother     ROS:  Pertinent items are noted in HPI.  Otherwise, a comprehensive ROS was negative.  Exam:   BP 140/82  Pulse 64  Resp 16  Ht 5' 7.75" (1.721 m)  Wt 185 lb 9.6 oz (84.188 kg)  BMI 28.42 kg/m2  LMP 10/07/2008  Weight change:-8lbs   Height: 5' 7.75" (172.1 cm)  Ht Readings from Last 3 Encounters:  09/02/12 5' 7.75" (1.721 m)  05/24/12 5\' 8"  (1.727 m)  11/24/11 5\' 8"  (1.727 m)    General appearance: alert, cooperative and appears stated age Head: Normocephalic, without obvious abnormality, atraumatic Neck: no adenopathy, supple, symmetrical, trachea midline and thyroid normal to inspection and palpation Lungs: clear to auscultation bilaterally Breasts: normal appearance, no masses or tenderness, bilateral implants, s/p bilateral mastectomy, nipple tattoos  Heart: regular rate and rhythm Abdomen: soft, non-tender; bowel sounds normal; no masses,  no organomegaly Extremities: extremities normal, atraumatic, no cyanosis or edema Skin: Skin color, texture, turgor normal. No rashes or lesions Lymph nodes: Cervical, supraclavicular, and axillary nodes normal.  No abnormal inguinal nodes palpated Neurologic: Grossly normal   Pelvic: External genitalia:  no lesions              Urethra:  normal appearing urethra with no masses, tenderness or lesions              Bartholins and Skenes: normal                 Vagina: normal appearing vagina with normal color and discharge, no lesions              Cervix: no lesions              Pap taken: no Bimanual Exam:  Uterus:  normal size, contour, position, consistency, mobility, non-tender              Adnexa: no mass, fullness, tenderness and ovaries absent               Rectovaginal: Confirms               Anus:  normal  sphincter tone, no lesions  A:  Well Woman with normal exam H/O breast cancer s/p bilateral mastectomy with implants S/P laparoscopic BSO H/O Hodgkin's Disease Vit D deficiency  P:   Mammogram not done due to bilateral mastectomies.  Will have intermittent MRIs per Dr. Darnelle Catalan pap smear with neg HR HPV testing 07/30/11.  No Pap today. Follow up in three months for repeat Vit D and FLP. 50,000K IU Vit D weekly for three months.  return annually or prn  An After Visit Summary was printed and given to the patient.

## 2012-09-02 NOTE — Patient Instructions (Signed)

## 2012-11-28 ENCOUNTER — Other Ambulatory Visit: Payer: Self-pay | Admitting: *Deleted

## 2012-11-28 DIAGNOSIS — C50919 Malignant neoplasm of unspecified site of unspecified female breast: Secondary | ICD-10-CM

## 2012-11-29 ENCOUNTER — Telehealth: Payer: Self-pay | Admitting: *Deleted

## 2012-11-29 ENCOUNTER — Ambulatory Visit (HOSPITAL_BASED_OUTPATIENT_CLINIC_OR_DEPARTMENT_OTHER): Payer: 59 | Admitting: Lab

## 2012-11-29 ENCOUNTER — Other Ambulatory Visit: Payer: Self-pay | Admitting: *Deleted

## 2012-11-29 ENCOUNTER — Ambulatory Visit (HOSPITAL_BASED_OUTPATIENT_CLINIC_OR_DEPARTMENT_OTHER): Payer: 59 | Admitting: Oncology

## 2012-11-29 ENCOUNTER — Telehealth: Payer: Self-pay | Admitting: Obstetrics & Gynecology

## 2012-11-29 VITALS — BP 138/82 | HR 79 | Temp 98.2°F | Resp 18 | Ht 67.75 in | Wt 190.5 lb

## 2012-11-29 DIAGNOSIS — C50919 Malignant neoplasm of unspecified site of unspecified female breast: Secondary | ICD-10-CM

## 2012-11-29 DIAGNOSIS — C50519 Malignant neoplasm of lower-outer quadrant of unspecified female breast: Secondary | ICD-10-CM

## 2012-11-29 DIAGNOSIS — Z170 Estrogen receptor positive status [ER+]: Secondary | ICD-10-CM

## 2012-11-29 DIAGNOSIS — C50511 Malignant neoplasm of lower-outer quadrant of right female breast: Secondary | ICD-10-CM

## 2012-11-29 DIAGNOSIS — Z8571 Personal history of Hodgkin lymphoma: Secondary | ICD-10-CM

## 2012-11-29 LAB — COMPREHENSIVE METABOLIC PANEL (CC13)
AST: 16 U/L (ref 5–34)
Albumin: 4 g/dL (ref 3.5–5.0)
BUN: 18.8 mg/dL (ref 7.0–26.0)
CO2: 26 mEq/L (ref 22–29)
Calcium: 10.1 mg/dL (ref 8.4–10.4)
Chloride: 105 mEq/L (ref 98–109)
Creatinine: 0.7 mg/dL (ref 0.6–1.1)
Potassium: 4.3 mEq/L (ref 3.5–5.1)

## 2012-11-29 LAB — CBC WITH DIFFERENTIAL/PLATELET
Basophils Absolute: 0.1 10*3/uL (ref 0.0–0.1)
EOS%: 6.2 % (ref 0.0–7.0)
Eosinophils Absolute: 0.4 10*3/uL (ref 0.0–0.5)
HCT: 41.8 % (ref 34.8–46.6)
HGB: 14 g/dL (ref 11.6–15.9)
MCH: 30.3 pg (ref 25.1–34.0)
MONO#: 0.5 10*3/uL (ref 0.1–0.9)
NEUT#: 3.9 10*3/uL (ref 1.5–6.5)
NEUT%: 63.9 % (ref 38.4–76.8)
RDW: 13.2 % (ref 11.2–14.5)
WBC: 6 10*3/uL (ref 3.9–10.3)
lymph#: 1.2 10*3/uL (ref 0.9–3.3)

## 2012-11-29 NOTE — Progress Notes (Signed)
ID: Denise Hull   DOB: February 05, 1961  MR#: [id number]  WUJ#:[id number]  PCP: GYN: M. Leda Quail SU: Darnell Level, Felicity Pellegrini OTHER MD: Elmon Else  HISTORY OF PRESENT ILLNESS: The patient had routine screening mammography Jul 11, 2008.  This showed dense breasts with benign-appearing calcifications, but a possible small mass on distortion in the upper outer quadrant of the right breast.  The patient was recalled for further studies on May 12 and this showed a 2 cm spiculated mass in the right breast, with ultrasound showing this to be hypoechoic, irregular and measuring 1.8 cm.  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    With this information, the patient was referred to Dr Carolynne Edouard and bilateral breast MRI was obtained on June 2.  This showed, in addition to extremely dense breasts, a 2.4 cm oval mass in the upper outer right breast, with no other masses or areas of abnormal enhancement in either breast.  There were no abnormal-appearing lymph nodes.    The patient proceeded to bilateral mastectomies (since she had mantle irradiation previously for Hodgkin disease) August 28, 2008.  The final pathology ([id number]) showed on the right the 2.2 cm invasive ductal carcinoma previously biopsied, grade 2, with negative margins, and 0 of 5 sentinel lymph nodes were involved.  On the left, there was an unsuspected focus of high-grade ductal carcinoma in situ measuring 1.3 cm and focally involving the deep margin; 5 sentinel lymph nodes were removed from the left axilla and they were all negative.  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  The patient's adjuvant treatment is summarized below.  INTERVAL HISTORY: Denise Hull returns for followup of her  breast cancer. Interval history is significant for her now having 2 jobs, both fairly physical. However she is not exercising outside of work. She was also started recently on vitamin D supplementation by Dr. Hyacinth Meeker. Denise Hull recently returned from a beach trip with her family which she greatly enjoyed. Also she finally has her daughter's wedding behind her. 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  REVIEW OF SYSTEMS: Denise Hull is concerned about her weight gain. She understands this is related to menopause, but also of course to lack of exercise. She is concerned with all the "metal" in her right leg that he she went bike riding and fell and injured it would be a major problem. She does have an indoor bike but "that's no fine". Aside from the weight and exercise issue, she has moderate hot flashes. She does have pain and soreness in her right leg but she really does not like to take any pain medication even Tylenol for that. Rarely she will taken Advil PM. She feels a little forgetful and scatterbrained at times. Otherwise a detailed review systems today was entirely noncontributory  PAST MEDICAL HISTORY: 1. 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  She underwent mantle radiation.  Did not undergo splenectomy.  The patient tells me she does not have hypothyroidism to the best of her knowledge. 2. The patient has had multiple basal cells, I believe all of them (from her description) were in the radiation field.  She is followed by Elmon Else for this.  3. There is a history of endometrial bleeding secondary to polyps and fibroids.  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  The patient's mother is alive.  The patient has 3 brothers and 1 sister.  There is no history of breast cancer in the immediate family, although the patient's father had 2 sisters and both were diagnosed with breast cancer in their 39s.  There is no history of ovarian cancer in the family.  GYNECOLOGIC HISTORY: She is GX, P1.  She delivered age 37,  just before she was diagnosed with Hodgkin disease in 54. Her periods always were  Irregular and heavy; she was on oral contraceptives to regulate this. That was discontinued at the time of her breast cancer diagnosis. 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  The job is very physical and she has to move 40-pound bales of cloth sometimes to locations above her head.  Her husband [name] is a Best boy for a [employer].  Their daughter [name] is married and living in [place].  The patient attends [place]. [employer].   ADVANCED DIRECTIVES: in place  HEALTH MAINTENANCE: History  Substance Use Topics  . Smoking status: Never Smoker   . Smokeless tobacco: Not on file  . Alcohol Use: 1.5 oz/week    3 drink(s) per week     Colonoscopy: May 2012 [address])  PAP: May 2012  Bone density: SOLIS May 2013; T -1.4 R fem neck  Lipid panel:  TSH:  Allergies  Allergen Reactions  . Aspirin     REACTION: Nosebleeds  . Sulfonamide Derivatives Other (See Comments)    Vaginal Burning     Current Outpatient Prescriptions  Medication Sig Dispense Refill  . anastrozole (ARIMIDEX) 1 MG tablet TAKE ONE TABLET BY MOUTH ONE TIME DAILY  30 tablet  5  . Calcium Carbonate (CALTRATE 600 PO) Take by mouth.      . ergocalciferol (VITAMIN D2) 50000 UNITS capsule Take 1 capsule (50,000 Units total) by mouth once a week.  4 capsule  3  . Loratadine (CLARITIN PO) Take by  mouth.      . Multiple Vitamins-Minerals (MULTIVITAMIN PO) Take by mouth.       No current facility-administered medications for this visit.    OBJECTIVE: Middle-aged white woman in no acute distress Filed Vitals:   11/29/12 1156  BP: 138/82  Pulse: 79  Temp: 98.2 F (36.8 C)  Resp: 18     Body mass index is 29.17 kg/(m^2).    ECOG FS: 0  Sclerae unicteric, pupils equal round and reactive to light Oropharynx clear, fair dentition No cervical or supraclavicular adenopathy; no thyromegaly  Lungs no rales or rhonchi Heart regular rate and rhythm Abd soft, nontender, positive bowel sounds MSK no focal spinal tenderness, no upper extremity lymphedema Neuro: nonfocal, well oriented, positive affect Breasts: Status post bilateral mastectomies with bilateral implants in place. There is no evidence of local recurrence on the right. On the left in the inferior mammary fold there is a 3 cm  scar. There is  no suggestion of local recurrence. There are some telangiectasias on the left over  the radiation port. Both axillae are benign  LAB RESULTS: Lab Results  Component Value Date   WBC 6.0 11/29/2012   NEUTROABS 3.9 11/29/2012   HGB 14.0 11/29/2012   HCT 41.8 11/29/2012   MCV 90.2 11/29/2012   PLT 286 11/29/2012      Chemistry      Component Value Date/Time   NA 141 05/24/2012 1030   NA 139 04/23/2011 1029   K 3.9 05/24/2012 1030   K 4.0 04/23/2011 1029   CL 104 05/24/2012 1030   CL 103 04/23/2011 1029   CO2 28 05/24/2012 1030   CO2 26 04/23/2011 1029   BUN 20.7 05/24/2012 1030   BUN 18 04/23/2011 1029   CREATININE 0.8 05/24/2012 1030   CREATININE 0.81 04/23/2011 1029      Component Value Date/Time   CALCIUM 9.9 05/24/2012 1030   CALCIUM 9.9 04/23/2011 1029   ALKPHOS 101 05/24/2012 1030   ALKPHOS 101 04/23/2011 1029   AST 18 05/24/2012 1030   AST 18 04/23/2011 1029   ALT 21 05/24/2012 1030   ALT 17 04/23/2011 1029   BILITOT 0.49 05/24/2012 1030   BILITOT 0.5 04/23/2011 1029       No  results found for this basename: INR,  in the last 168 hours  No results found for this basename: UACOL, UAPR, USPG, UPH, UTP, UGL, UKET, UBIL, UHGB, UNIT, UROB, ULEU, UEPI, UWBC, URBC, UBAC, CAST, CRYS, UCOM, BILUA,  in the last 72 hours   STUDIES: Bone density obtained 05/18/ 2013, shows a T score of -1.4.  ASSESSMENT: 52 y.o. BRCA negative [place] woman with a history of  1.  Stage II nodular sclerosing Hodgkin's disease diagnosed in 1991 and treated with mantle radiation alone (no splenectomy).  She completed all treatments November 1991.  There is no evidence of disease recurrence or hypothyroidism to date.  2.  Status post bilateral mastectomies May 2010 for a right sided T2 N0, Stage IIA invasive ductal carcinoma, grade 2 , estrogen and progesterone receptor positive, HER2/neu negative, with an MIB-1 of 38%.  She was treated with cyclophosphamide and docetaxel times six followed by radiation completed in March 2011.  She was started on anastrozole at that time.  3. 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    PLAN: Nikitta is doing fine from a breast cancer point of view. She will return to see [place] in March for routine followup. She will have a TSH as well as routine lab work before that visit. We will continue to do physical exam or her breasts twice a year and of course she also sees Dr. Hyacinth Meeker. The plan is to continue anastrozole through March of 2016. Denise Hull has a good understanding of the overall plan. She knows to call for any problems that may develop before her next visit here.  Denise Hull C    11/29/2012

## 2012-11-29 NOTE — Telephone Encounter (Signed)
Patient canceled her lab appointment 12/02/12. Patient will have labs done at Dr. Darrall Dears office and have results sent to Dr. Hyacinth Meeker.

## 2012-11-29 NOTE — Addendum Note (Signed)
Addended by: Billey Co on: 11/29/2012 05:22 PM   Modules accepted: Medications

## 2012-11-29 NOTE — Telephone Encounter (Signed)
appts made and printed...td 

## 2012-11-30 LAB — VITAMIN D 25 HYDROXY (VIT D DEFICIENCY, FRACTURES): Vit D, 25-Hydroxy: 49 ng/mL (ref 30–89)

## 2012-12-02 ENCOUNTER — Other Ambulatory Visit: Payer: 59

## 2013-01-28 ENCOUNTER — Other Ambulatory Visit: Payer: Self-pay | Admitting: Obstetrics & Gynecology

## 2013-01-30 NOTE — Telephone Encounter (Signed)
Patient had Vitamin D level checked on 11/29/12 with Dr Darnelle Catalan that was 46. Patient has been on Vitamin D 50,000 weekly. Please advise if ok to use this level and refill or should patient use OTC Vitamin D.

## 2013-05-19 ENCOUNTER — Other Ambulatory Visit: Payer: Self-pay | Admitting: *Deleted

## 2013-05-19 DIAGNOSIS — C50519 Malignant neoplasm of lower-outer quadrant of unspecified female breast: Secondary | ICD-10-CM

## 2013-05-22 ENCOUNTER — Other Ambulatory Visit (HOSPITAL_BASED_OUTPATIENT_CLINIC_OR_DEPARTMENT_OTHER): Payer: PRIVATE HEALTH INSURANCE

## 2013-05-22 DIAGNOSIS — C50419 Malignant neoplasm of upper-outer quadrant of unspecified female breast: Secondary | ICD-10-CM

## 2013-05-22 DIAGNOSIS — C50919 Malignant neoplasm of unspecified site of unspecified female breast: Secondary | ICD-10-CM

## 2013-05-22 DIAGNOSIS — C50519 Malignant neoplasm of lower-outer quadrant of unspecified female breast: Secondary | ICD-10-CM

## 2013-05-22 DIAGNOSIS — C50511 Malignant neoplasm of lower-outer quadrant of right female breast: Secondary | ICD-10-CM

## 2013-05-22 LAB — CBC WITH DIFFERENTIAL/PLATELET
BASO%: 1.1 % (ref 0.0–2.0)
Basophils Absolute: 0.1 10*3/uL (ref 0.0–0.1)
EOS ABS: 0.2 10*3/uL (ref 0.0–0.5)
EOS%: 3.7 % (ref 0.0–7.0)
HEMATOCRIT: 40.7 % (ref 34.8–46.6)
HEMOGLOBIN: 13.7 g/dL (ref 11.6–15.9)
LYMPH%: 23.2 % (ref 14.0–49.7)
MCH: 30.3 pg (ref 25.1–34.0)
MCHC: 33.6 g/dL (ref 31.5–36.0)
MCV: 90.2 fL (ref 79.5–101.0)
MONO#: 0.4 10*3/uL (ref 0.1–0.9)
MONO%: 9.2 % (ref 0.0–14.0)
NEUT%: 62.8 % (ref 38.4–76.8)
NEUTROS ABS: 2.8 10*3/uL (ref 1.5–6.5)
PLATELETS: 261 10*3/uL (ref 145–400)
RBC: 4.51 10*6/uL (ref 3.70–5.45)
RDW: 13 % (ref 11.2–14.5)
WBC: 4.5 10*3/uL (ref 3.9–10.3)
lymph#: 1 10*3/uL (ref 0.9–3.3)

## 2013-05-22 LAB — COMPREHENSIVE METABOLIC PANEL (CC13)
ALT: 14 U/L (ref 0–55)
ANION GAP: 12 meq/L — AB (ref 3–11)
AST: 14 U/L (ref 5–34)
Albumin: 4.1 g/dL (ref 3.5–5.0)
Alkaline Phosphatase: 89 U/L (ref 40–150)
BILIRUBIN TOTAL: 0.59 mg/dL (ref 0.20–1.20)
BUN: 19.9 mg/dL (ref 7.0–26.0)
CO2: 26 meq/L (ref 22–29)
CREATININE: 0.7 mg/dL (ref 0.6–1.1)
Calcium: 9.9 mg/dL (ref 8.4–10.4)
Chloride: 105 mEq/L (ref 98–109)
Glucose: 93 mg/dl (ref 70–140)
Potassium: 3.9 mEq/L (ref 3.5–5.1)
Sodium: 142 mEq/L (ref 136–145)
Total Protein: 6.9 g/dL (ref 6.4–8.3)

## 2013-05-22 LAB — TSH CHCC: TSH: 2.826 m(IU)/L (ref 0.308–3.960)

## 2013-05-23 LAB — VITAMIN D 25 HYDROXY (VIT D DEFICIENCY, FRACTURES): Vit D, 25-Hydroxy: 37 ng/mL (ref 30–89)

## 2013-05-23 LAB — LIPID PANEL
CHOL/HDL RATIO: 2.9 ratio
CHOLESTEROL: 205 mg/dL — AB (ref 0–200)
HDL: 71 mg/dL (ref >39)
LDL Cholesterol: 117 mg/dL — ABNORMAL HIGH (ref 0–99)
Triglycerides: 85 mg/dL (ref <150)
VLDL: 17 mg/dL (ref 0–40)

## 2013-05-29 ENCOUNTER — Encounter: Payer: Self-pay | Admitting: Physician Assistant

## 2013-05-29 ENCOUNTER — Telehealth: Payer: Self-pay | Admitting: Oncology

## 2013-05-29 ENCOUNTER — Ambulatory Visit (HOSPITAL_BASED_OUTPATIENT_CLINIC_OR_DEPARTMENT_OTHER): Payer: PRIVATE HEALTH INSURANCE | Admitting: Physician Assistant

## 2013-05-29 DIAGNOSIS — C50511 Malignant neoplasm of lower-outer quadrant of right female breast: Secondary | ICD-10-CM

## 2013-05-29 DIAGNOSIS — Z8571 Personal history of Hodgkin lymphoma: Secondary | ICD-10-CM

## 2013-05-29 DIAGNOSIS — C50419 Malignant neoplasm of upper-outer quadrant of unspecified female breast: Secondary | ICD-10-CM

## 2013-05-29 DIAGNOSIS — Z170 Estrogen receptor positive status [ER+]: Secondary | ICD-10-CM

## 2013-05-29 DIAGNOSIS — Z853 Personal history of malignant neoplasm of breast: Secondary | ICD-10-CM

## 2013-05-29 DIAGNOSIS — Z85828 Personal history of other malignant neoplasm of skin: Secondary | ICD-10-CM

## 2013-05-29 NOTE — Progress Notes (Signed)
ID: Denise Hull   DOB: 1960/10/09  MR#: [id number]  UVO#:[id number]  PCP: GYN: M. Edwinna Areola, MD SU: [name], MD;  Star Age, MD OTHER MD: Jari Pigg, MD  CHIEF COMPLAINT:  Hx of Right Breast Cancer    HISTORY OF PRESENT ILLNESS: The patient had routine screening mammography Jul 11, 2008.  This showed dense breasts with benign-appearing calcifications, but a possible small mass on distortion in the upper outer quadrant of the right breast.  The patient was recalled for further studies on May 12 and this showed a 2 cm spiculated mass in the right breast, with ultrasound showing this to be hypoechoic, irregular and measuring 1.8 cm.  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    With this information, the patient was referred to Dr Marlou Starks and bilateral breast MRI was obtained on June 2.  This showed, in addition to extremely dense breasts, a 2.4 cm oval mass in the upper outer right breast, with no other masses or areas of abnormal enhancement in either breast.  There were no abnormal-appearing lymph nodes.    The patient proceeded to bilateral mastectomies (since she had mantle irradiation previously for Hodgkin disease) August 28, 2008.  The final pathology ([id number]) showed on the right the 2.2 cm invasive ductal carcinoma previously biopsied, grade 2, with negative margins, and 0 of 5 sentinel lymph nodes were involved.  On the left, there was an unsuspected focus of high-grade ductal carcinoma in situ measuring 1.3 cm and focally involving the deep margin; 5 sentinel lymph nodes were removed from the left axilla and they were all negative.  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    The patient's subsequent treatment  history is summarized below.  INTERVAL HISTORY: Denise Hull returns today for routine six-month followup of her right breast carcinoma. Interval history is generally unremarkable. 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 Her daughter recently got married, and they are living close by in [place].  Denise Hull continues on anastrozole with good tolerance. She has occasional hot flashes, but nothing she would consider problematic. She also denies any problems with vaginal dryness or increased arthralgias.    REVIEW OF SYSTEMS: [name]  has had no recent illnesses and denies any fevers or chills. She has a history of basal cell carcinoma removal, most recently on the left chest wall. She's had no additional skin changes or rashes. She denies any abnormal bruising or bleeding. Her energy level is good, although she admits that she has not been exercising as regularly as she should.  She denies any nausea or emesis and has had no change in bowel or bladder habits. Denise Hull denies any increased cough, shortness of breath, chest pain, palpitations. She's had no abnormal headaches or dizziness but does feel forgetful at times. She denies any new or unusual myalgias, arthralgias, bony pain, or peripheral swelling. She does have some chronic pain in the right leg with a remote history of injury due to a fall.  A detailed review of systems is otherwise stable and noncontributory.    PAST MEDICAL HISTORY: 1. 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  She underwent mantle radiation.  Did not undergo  splenectomy.  The patient tells me she does not have hypothyroidism to the best of her knowledge. 2. The patient has had multiple basal cells, I believe all of them (from her description) were in the radiation field.  She is followed by Jari Pigg for this. 3. There is a history of endometrial bleeding secondary to polyps and fibroids.  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  The patient's mother is alive.  The patient has 3 brothers and 1 sister.  There is no history of breast cancer in the immediate family, although the patient's father had 2 sisters and both were diagnosed with breast cancer in their 54s.  There is no history of ovarian cancer in the family.  GYNECOLOGIC HISTORY:  (Updated 05/29/2013)  She is GX, P1.  She delivered age 53,  just before she was diagnosed with Hodgkin disease in 74. Her periods always were  Irregular and heavy; she was on oral contraceptives to regulate this. That was discontinued at the time of her breast cancer diagnosis. She underwent bilateral oophorectomy age 53 (no hysterectomy)  SOCIAL HISTORY:  (Updated 05/29/2013) She works in  Community education officer.  Her husband Eddie Dibbles is a Psychologist, sport and exercise for a [employer].  Their daughter [name] is married and living in [place].  The patient attends [employer].   ADVANCED DIRECTIVES: in place  HEALTH MAINTENANCE:  (Updated 05/29/2013) History  Substance Use Topics  . Smoking status: Never Smoker   . Smokeless tobacco: Not on file  . Alcohol Use: 1.5 oz/week    3 drink(s) per week     Colonoscopy: May 2012 Collene Mares)  PAP: May 2012  Bone density: [facility] May 2013; osteopenia with a T score of -1.4 R fem neck  Lipid panel: March 2015, elevated  TSH: March 2015, normal     Allergies  Allergen Reactions  . Aspirin     REACTION: Nosebleeds  . Sulfonamide Derivatives Other (See Comments)    Vaginal Burning     Current Outpatient Prescriptions  Medication Sig Dispense  Refill  . anastrozole (ARIMIDEX) 1 MG tablet TAKE ONE TABLET BY MOUTH ONE TIME DAILY  30 tablet  5  . Calcium Carbonate (CALTRATE 600 PO) Take by mouth.      . Ibuprofen-Diphenhydramine Cit (ADVIL PM PO) Take 1 tablet by mouth at bedtime as needed.      . Loratadine (CLARITIN PO) Take by mouth.      . Multiple Vitamins-Minerals (MULTIVITAMIN PO) Take by mouth.      . Vitamin D, Ergocalciferol, (DRISDOL) 50000 UNITS CAPS capsule TAKE ONE CAPSULE BY MOUTH WEEKLY   4 capsule  8   No current facility-administered medications for this visit.    OBJECTIVE: Middle-aged white woman who appears well and is in no acute distress   ECOG FS: 0  Physical Exam: HEENT:  Sclerae anicteric.  Oropharynx clear, pink and moist. Neck supple, trachea midline.  NODES:  No cervical or supraclavicular lymphadenopathy palpated.  BREAST EXAM:  Patient is status post bilateral mastectomies with bilateral implant reconstruction. Implants are intact bilaterally. There are no nodularities and no skin changes, no evidence of local recurrence on the right. Axillae are  benign bilaterally, with no palpable lymphadenopathy. LUNGS:  Clear to auscultation bilaterally with good excursion.  No wheezes or rhonchi HEART:  Regular rate and rhythm. No murmur  ABDOMEN:  Soft, nontender.  Positive bowel sounds.  MSK:  No focal spinal tenderness to palpation. Good range of motion bilaterally in the upper extremities. EXTREMITIES:  No peripheral edema.  No lymphedema noted in the right upper extremity. SKIN:  Benign. No visible rashes or skin lesions. No excessive ecchymoses. No petechiae. NEURO:  Nonfocal. Well oriented.  Appropriate affect.    LAB RESULTS: Lab Results  Component Value Date   WBC 4.5 05/22/2013   NEUTROABS 2.8 05/22/2013   HGB 13.7 05/22/2013   HCT 40.7 05/22/2013   MCV 90.2 05/22/2013   PLT 261 05/22/2013      Chemistry      Component Value Date/Time   NA 142 05/22/2013 0925   NA 139 04/23/2011 1029   K 3.9  05/22/2013 0925   K 4.0 04/23/2011 1029   CL 104 05/24/2012 1030   CL 103 04/23/2011 1029   CO2 26 05/22/2013 0925   CO2 26 04/23/2011 1029   BUN 19.9 05/22/2013 0925   BUN 18 04/23/2011 1029   CREATININE 0.7 05/22/2013 0925   CREATININE 0.81 04/23/2011 1029      Component Value Date/Time   CALCIUM 9.9 05/22/2013 0925   CALCIUM 9.9 04/23/2011 1029   ALKPHOS 89 05/22/2013 0925   ALKPHOS 101 04/23/2011 1029   AST 14 05/22/2013 0925   AST 18 04/23/2011 1029   ALT 14 05/22/2013 0925   ALT 17 04/23/2011 1029   BILITOT 0.59 05/22/2013 0925   BILITOT 0.5 04/23/2011 1029     Lipid panel drawn on 05/22/2013 showed an elevated LDL cholesterol of 117 with an elevated total cholesterol of 205. HDL was normal at 71 and triglycerides were normal at 85.  Vitamin D, 25 Hydroxy 47  05/22/2013  TSH    2.826  03/16/215    STUDIES: Bone density obtained  at [facility] 05/18/ 2013, shows  osteopenia with a T score of -1.4.    ASSESSMENT: 53 y.o. BRCA negative [place] woman with a history of  1.  Stage II nodular sclerosing Hodgkin's disease diagnosed in 1991 and treated with mantle radiation alone (no splenectomy).  She completed all treatments November 1991.  There is no evidence of disease recurrence or hypothyroidism to date.  2.  Status post bilateral mastectomies May 2010 for a right sided T2 N0, Stage IIA invasive ductal carcinoma, grade 2 , estrogen and progesterone receptor positive, HER2/neu negative, with an MIB-1 of 38%.  She was treated with cyclophosphamide and docetaxel times six followed by radiation completed in March 2011.  She was started on anastrozole at that time.  3. 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    PLAN:  Yecenia continues to do well with regards to her breast cancer, and there is no clinical evidence of disease recurrence. I'm making no change in her current regimen, and we have refilled her  anastrozole for another year.   As discussed previously, we will obtain a repeat bone density this spring, and a repeat breast MRI in late August.  She will return to see [place] for routine followup again in 6 months, September 2016.  She'll then see Dr. Jana Hakim in March of 2016 at which time she will have completed 5 years on anastrozole.   All the above was reviewed  in detail with Gwenette Greet who voices understanding and agreement with this plan. She will call with any changes or problems prior to her next appointment.   Shellye Zandi  PA-C   05/29/2013

## 2013-05-31 ENCOUNTER — Other Ambulatory Visit: Payer: Self-pay | Admitting: Physician Assistant

## 2013-05-31 DIAGNOSIS — Z780 Asymptomatic menopausal state: Secondary | ICD-10-CM

## 2013-05-31 DIAGNOSIS — M8580 Other specified disorders of bone density and structure, unspecified site: Secondary | ICD-10-CM

## 2013-05-31 DIAGNOSIS — Z853 Personal history of malignant neoplasm of breast: Secondary | ICD-10-CM

## 2013-06-01 ENCOUNTER — Telehealth: Payer: Self-pay | Admitting: Oncology

## 2013-06-01 NOTE — Telephone Encounter (Signed)
, °

## 2013-06-14 ENCOUNTER — Encounter: Payer: Self-pay | Admitting: Oncology

## 2013-06-14 NOTE — Progress Notes (Addendum)
DID A PA REQUEST ONLINE WITH "[facility]" AND ALSO FAXED A FORM TO [facility] WITH THE LAST OFFICE NOTE FOR HER ANASTROZOLE.  06/15/13 FAX FROM [facility] STATING THAT HER ANASTROZOLE HAS BEEN APPROVED FROM 06/15/13 UNTIL 06/15/16.  REFERRAL #[id number].  I FAXED A COPY OF THE AUTHORIZATION TO [facility] @ [phone].

## 2013-07-10 ENCOUNTER — Other Ambulatory Visit: Payer: Self-pay | Admitting: Oncology

## 2013-07-10 DIAGNOSIS — C50519 Malignant neoplasm of lower-outer quadrant of unspecified female breast: Secondary | ICD-10-CM

## 2013-07-12 ENCOUNTER — Other Ambulatory Visit: Payer: Self-pay | Admitting: Dermatology

## 2013-10-05 ENCOUNTER — Ambulatory Visit (INDEPENDENT_AMBULATORY_CARE_PROVIDER_SITE_OTHER): Payer: No Typology Code available for payment source | Admitting: Obstetrics & Gynecology

## 2013-10-05 ENCOUNTER — Encounter: Payer: Self-pay | Admitting: Obstetrics & Gynecology

## 2013-10-05 VITALS — BP 108/60 | HR 80 | Resp 18 | Ht 67.5 in | Wt 201.0 lb

## 2013-10-05 DIAGNOSIS — Z01419 Encounter for gynecological examination (general) (routine) without abnormal findings: Secondary | ICD-10-CM

## 2013-10-05 DIAGNOSIS — Z0000 Encounter for general adult medical examination without abnormal findings: Secondary | ICD-10-CM

## 2013-10-05 DIAGNOSIS — Z124 Encounter for screening for malignant neoplasm of cervix: Secondary | ICD-10-CM

## 2013-10-05 LAB — POCT URINALYSIS DIPSTICK
BILIRUBIN UA: NEGATIVE
Blood, UA: NEGATIVE
Glucose, UA: NEGATIVE
KETONES UA: NEGATIVE
LEUKOCYTES UA: NEGATIVE
Nitrite, UA: NEGATIVE
Protein, UA: NEGATIVE
Urobilinogen, UA: NEGATIVE
pH, UA: 6

## 2013-10-05 MED ORDER — VITAMIN D (ERGOCALCIFEROL) 1.25 MG (50000 UNIT) PO CAPS: 50000.0000 [IU] | ORAL_CAPSULE | ORAL | Status: DC

## 2013-10-05 NOTE — Progress Notes (Signed)
53 y.o. G1P1001 MarriedCaucasianF here for annual exam.  Doing well.  Sees Dr. Shellia Carwin.  Last visit was 05/22/13.  Reviewed labs with pt.  She is frustrated with her weight.  She is also snoring more.  Considering seeing an ENT.     Patient's last menstrual period was 10/07/2008.          Sexually active: Yes.    The current method of family planning is post menopausal status.    Exercising: No.  The patient does not participate in regular exercise at present. Smoker:  no  Health Maintenance: Pap:  07/2011 Neg. HR HPV Neg History of abnormal Pap:  yes MMG: H/O bilateral mastectomies Colonoscopy:  07/2010 Repeat 5 years  BMD:   07/2011  TDaP:  2009 Screening Labs: Oncology, Hb today: Oncology, Urine today:Neg   reports that she has never smoked. She has never used smokeless tobacco. She reports that she drinks about 2.4 ounces of alcohol per week. She reports that she does not use illicit drugs.  Past Medical History  Diagnosis Date  . GERD (gastroesophageal reflux disease)   . Invasive ductal carcinoma of breast 8/10    ER+/PR+  . Basal cell carcinoma   . History of Hodgkin's disease 46    Past Surgical History  Procedure Laterality Date  . Tibia fracture surgery  12/11    Repair  . Lymph node biopsy    . Basal cell carcinoma excision      Removal  . Breast biopsy  5/10    Invasive ductal changes  . Breast lumpectomy Bilateral 5/10    - nodes  . Mastectomy Bilateral 08/28/08  . Laparoscopic bilateral salpingo oopherectomy  03/07/09    with implant replacement with Dr. Harlow Mares  . Portacath placement  7/10    removed 12/10    Current Outpatient Prescriptions  Medication Sig Dispense Refill  . anastrozole (ARIMIDEX) 1 MG tablet TAKE ONE TABLET BY MOUTH ONE TIME DAILY   30 tablet  4  . Calcium Carbonate (CALTRATE 600 PO) Take by mouth.      . Multiple Vitamins-Minerals (MULTIVITAMIN PO) Take by mouth.      . Vitamin D, Ergocalciferol, (DRISDOL) 50000 UNITS CAPS  capsule TAKE ONE CAPSULE BY MOUTH WEEKLY   4 capsule  8  . Ibuprofen-Diphenhydramine Cit (ADVIL PM PO) Take 1 tablet by mouth at bedtime as needed.      . Loratadine (CLARITIN PO) Take by mouth.       No current facility-administered medications for this visit.    Family History  Problem Relation Age of Onset  . Thyroid disease Mother   . Thyroid disease Father   . Hypertension Paternal Grandmother     ROS:  Pertinent items are noted in HPI.  Otherwise, a comprehensive ROS was negative.  Exam:   BP 108/60  Pulse 80  Resp 18  Ht 5' 7.5" (1.715 m)  Wt 201 lb (91.173 kg)  BMI 31.00 kg/m2  LMP 10/07/2008  Weight change:  +18#s Height: 5' 7.5" (171.5 cm)  Ht Readings from Last 3 Encounters:  10/05/13 5' 7.5" (1.715 m)  11/29/12 5' 7.75" (1.721 m)  09/02/12 5' 7.75" (1.721 m)    General appearance: alert, cooperative and appears stated age Head: Normocephalic, without obvious abnormality, atraumatic Neck: no adenopathy, supple, symmetrical, trachea midline and thyroid normal to inspection and palpation Lungs: clear to auscultation bilaterally Breasts: normal appearance, no masses or tenderness, with bilateral implants Heart: regular rate and rhythm Abdomen: soft,  non-tender; bowel sounds normal; no masses,  no organomegaly Extremities: extremities normal, atraumatic, no cyanosis or edema Skin: Skin color, texture, turgor normal. No rashes or lesions Lymph nodes: Cervical, supraclavicular, and axillary nodes normal. No abnormal inguinal nodes palpated Neurologic: Grossly normal   Pelvic: External genitalia:  no lesions              Urethra:  normal appearing urethra with no masses, tenderness or lesions              Bartholins and Skenes: normal                 Vagina: normal appearing vagina with normal color and discharge, no lesions              Cervix: no lesions              Pap taken: Yes.   Bimanual Exam:  Uterus:  normal size, contour, position, consistency,  mobility, non-tender              Adnexa: no mass, fullness, tenderness               Rectovaginal: Confirms               Anus:  normal sphincter tone, no lesions  A:  Well Woman with normal exam  H/O breast cancer s/p bilateral mastectomy with implants  S/P laparoscopic BSO  H/O Hodgkin's Disease  Vit D deficiency  BCCs.  Sees Dr. Delman Cheadle every six months.    P: Mammogram not done due to bilateral mastectomies. Will have intermittent MRIs per Dr. Jana Hakim  pap smear with neg HR HPV testing 07/30/11.  Pap today.  50,000K IU Vit D weekly.  Rx to pharmacy.    An After Visit Summary was printed and given to the patient.  \

## 2013-10-09 LAB — IPS PAP TEST WITH REFLEX TO HPV

## 2013-10-11 ENCOUNTER — Other Ambulatory Visit: Payer: Self-pay | Admitting: Dermatology

## 2013-11-10 ENCOUNTER — Ambulatory Visit (HOSPITAL_COMMUNITY): Payer: No Typology Code available for payment source

## 2013-11-29 ENCOUNTER — Ambulatory Visit: Payer: PRIVATE HEALTH INSURANCE | Admitting: Oncology

## 2013-11-29 ENCOUNTER — Other Ambulatory Visit: Payer: PRIVATE HEALTH INSURANCE

## 2013-11-30 ENCOUNTER — Other Ambulatory Visit: Payer: Self-pay | Admitting: *Deleted

## 2013-11-30 DIAGNOSIS — C50511 Malignant neoplasm of lower-outer quadrant of right female breast: Secondary | ICD-10-CM

## 2013-12-05 ENCOUNTER — Telehealth: Payer: Self-pay | Admitting: Oncology

## 2013-12-05 NOTE — Telephone Encounter (Signed)
Sent letter to patient from Dr. Magrinat. °

## 2014-01-08 ENCOUNTER — Encounter: Payer: Self-pay | Admitting: Obstetrics & Gynecology

## 2014-01-16 ENCOUNTER — Ambulatory Visit (HOSPITAL_COMMUNITY): Admission: RE | Admit: 2014-01-16 | Payer: No Typology Code available for payment source | Source: Ambulatory Visit

## 2014-04-17 ENCOUNTER — Other Ambulatory Visit: Payer: Self-pay | Admitting: Dermatology

## 2014-04-17 ENCOUNTER — Telehealth: Payer: Self-pay | Admitting: Oncology

## 2014-04-17 NOTE — Telephone Encounter (Signed)
pt cld to sch appt-gave pt time & date-pt understood

## 2014-07-09 ENCOUNTER — Ambulatory Visit (HOSPITAL_BASED_OUTPATIENT_CLINIC_OR_DEPARTMENT_OTHER): Payer: No Typology Code available for payment source | Admitting: Oncology

## 2014-07-09 ENCOUNTER — Telehealth: Payer: Self-pay | Admitting: Oncology

## 2014-07-09 ENCOUNTER — Other Ambulatory Visit (HOSPITAL_BASED_OUTPATIENT_CLINIC_OR_DEPARTMENT_OTHER): Payer: No Typology Code available for payment source

## 2014-07-09 VITALS — BP 143/74 | HR 90 | Temp 97.9°F | Resp 18 | Ht 67.5 in | Wt 202.0 lb

## 2014-07-09 DIAGNOSIS — Z8571 Personal history of Hodgkin lymphoma: Secondary | ICD-10-CM

## 2014-07-09 DIAGNOSIS — Z808 Family history of malignant neoplasm of other organs or systems: Secondary | ICD-10-CM | POA: Diagnosis not present

## 2014-07-09 DIAGNOSIS — Z853 Personal history of malignant neoplasm of breast: Secondary | ICD-10-CM | POA: Diagnosis not present

## 2014-07-09 DIAGNOSIS — Z1239 Encounter for other screening for malignant neoplasm of breast: Secondary | ICD-10-CM | POA: Insufficient documentation

## 2014-07-09 DIAGNOSIS — C50511 Malignant neoplasm of lower-outer quadrant of right female breast: Secondary | ICD-10-CM

## 2014-07-09 LAB — CBC WITH DIFFERENTIAL/PLATELET
BASO%: 1.2 % (ref 0.0–2.0)
Basophils Absolute: 0.1 10*3/uL (ref 0.0–0.1)
EOS ABS: 0.2 10*3/uL (ref 0.0–0.5)
EOS%: 3.5 % (ref 0.0–7.0)
HCT: 41 % (ref 34.8–46.6)
HEMOGLOBIN: 13.6 g/dL (ref 11.6–15.9)
LYMPH%: 21.3 % (ref 14.0–49.7)
MCH: 29.8 pg (ref 25.1–34.0)
MCHC: 33.3 g/dL (ref 31.5–36.0)
MCV: 89.6 fL (ref 79.5–101.0)
MONO#: 0.4 10*3/uL (ref 0.1–0.9)
MONO%: 7.9 % (ref 0.0–14.0)
NEUT#: 3.3 10*3/uL (ref 1.5–6.5)
NEUT%: 66.1 % (ref 38.4–76.8)
PLATELETS: 251 10*3/uL (ref 145–400)
RBC: 4.57 10*6/uL (ref 3.70–5.45)
RDW: 13.2 % (ref 11.2–14.5)
WBC: 5.1 10*3/uL (ref 3.9–10.3)
lymph#: 1.1 10*3/uL (ref 0.9–3.3)

## 2014-07-09 LAB — COMPREHENSIVE METABOLIC PANEL (CC13)
ALBUMIN: 3.9 g/dL (ref 3.5–5.0)
ALT: 21 U/L (ref 0–55)
AST: 19 U/L (ref 5–34)
Alkaline Phosphatase: 91 U/L (ref 40–150)
Anion Gap: 12 mEq/L — ABNORMAL HIGH (ref 3–11)
BUN: 18.3 mg/dL (ref 7.0–26.0)
CHLORIDE: 102 meq/L (ref 98–109)
CO2: 24 mEq/L (ref 22–29)
Calcium: 9.3 mg/dL (ref 8.4–10.4)
Creatinine: 0.7 mg/dL (ref 0.6–1.1)
Glucose: 120 mg/dl (ref 70–140)
POTASSIUM: 4 meq/L (ref 3.5–5.1)
SODIUM: 138 meq/L (ref 136–145)
Total Bilirubin: 0.7 mg/dL (ref 0.20–1.20)
Total Protein: 6.7 g/dL (ref 6.4–8.3)

## 2014-07-09 NOTE — Progress Notes (Signed)
ID: Denise Hull   DOB: August 18, 1960  MR#: [id number]  UTM#:[id number]  PCP: GYN: M. Edwinna Areola, MD SU: Denise Gemma, MD;  Denise Age, MD OTHER MD: Denise Pigg, MD, Denise Kato, MD  CHIEF COMPLAINT:  Hx of Right Breast Cancer  CURRENT TREATMENT: Observation    HISTORY OF PRESENT ILLNESS: From the original intake note:  The patient had routine screening mammography Jul 11, 2008.  This showed dense breasts with benign-appearing calcifications, but a possible small mass on distortion in the upper outer quadrant of the right breast.  The patient was recalled for further studies on May 12 and this showed a 2 cm spiculated mass in the right breast, with ultrasound showing this to be hypoechoic, irregular and measuring 1.8 cm.  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    With this information, the patient was referred to Dr Denise Hull and bilateral breast MRI was obtained on June 2.  This showed, in addition to extremely dense breasts, a 2.4 cm oval mass in the upper outer right breast, with no other masses or areas of abnormal enhancement in either breast.  There were no abnormal-appearing lymph nodes.    The patient proceeded to bilateral mastectomies (since she had mantle irradiation previously for Hodgkin disease) August 28, 2008.  The final pathology ([id number]) showed on the right the 2.2 cm invasive ductal carcinoma previously biopsied, grade 2, with negative margins, and 0 of 5 sentinel lymph nodes were involved.  On the left, there was an unsuspected focus of high-grade ductal carcinoma in situ measuring 1.3 cm and focally involving the deep margin; 5 sentinel lymph nodes were removed from the left axilla and they were all negative.  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    The patient's subsequent treatment history is summarized below.  INTERVAL HISTORY: Denise Hull returns today for follow-up of her breast cancer. She ran out of anastrozole late last year and simply did not renew. She was "close enough" to 5 years, she felt. After going off the medication, she has felt better. She thinks her mind is also working better--she now can do mass in her head, she says. She is not exercising because of the right leg problem. She cannot take long walks, which is what she used to do. She does have an exercise bike at home, but her husband has it "too tight" and in short she is gaining weight.  REVIEW OF SYSTEMS: Denise Hull just had a basal cell removed from her back. Sometimes when she swallows meet the food gets stuck on the way down. This does not happen with liquids. She doesn't have coughing problems while eating. Aside from these issues a detailed review of systems today was otherwise noncontributory  PAST MEDICAL HISTORY: 1. 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  She underwent mantle radiation.  Did not undergo splenectomy.  The patient tells me she does not have hypothyroidism to the best of her knowledge. 2. The patient has had multiple basal cells, I believe all of them (from her description) were in the radiation field.  She is followed by Denise Hull for this. 3. There is a history of endometrial bleeding secondary to polyps and fibroids.  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  The patient's mother is alive.   The patient has 3 brothers and 1 sister.  There is no history of breast cancer in the immediate family, although the patient's father had 2 sisters and both were diagnosed with breast cancer in their 32s.  There is no history of ovarian cancer in the family.  GYNECOLOGIC HISTORY:  (Updated 05/29/2013)  She is GX, P1.  She delivered Hull 13,  just before she was diagnosed with Hodgkin disease in 33. Her periods always were  Irregular and heavy; she was on oral contraceptives to regulate this. That was discontinued at the time of her breast cancer diagnosis. She underwent bilateral oophorectomy Hull 28 (no hysterectomy)  SOCIAL HISTORY:  (Updated 05/29/2013) She works in  Community education officer.  Her husband Denise Hull is a Psychologist, sport and exercise for a [employer].  Their daughter Denise Hull is married and living in [place].  The patient attends [facility].   ADVANCED DIRECTIVES: in place  HEALTH MAINTENANCE:  (Updated 05/29/2013) History  Substance Use Topics  . Smoking status: Never Smoker   . Smokeless tobacco: Never Used  . Alcohol Use: 2.4 oz/week    4 Glasses of wine per week     Colonoscopy: May 2012 Denise Hull)  PAP: May 2012  Bone density: SOLIS May 2013; osteopenia with a T score of -1.4 R fem neck  Lipid panel: March 2015, elevated  TSH: March 2015, normal     Allergies  Allergen Reactions  . Aspirin     REACTION: Nosebleeds  . Sulfonamide Derivatives Other (See Comments)    Vaginal Burning     Current Outpatient Prescriptions  Medication Sig Dispense Refill  . anastrozole (ARIMIDEX) 1 MG tablet TAKE ONE TABLET BY MOUTH ONE TIME DAILY  30 tablet 4  . Calcium Carbonate (CALTRATE 600 PO) Take by mouth.    . Ibuprofen-Diphenhydramine Cit (ADVIL PM PO) Take 1 tablet by mouth at bedtime as needed.    . Loratadine (CLARITIN PO) Take by mouth.    . Multiple Vitamins-Minerals (MULTIVITAMIN PO) Take by mouth.    . Vitamin D, Ergocalciferol, (DRISDOL) 50000 UNITS CAPS capsule Take 1  capsule (50,000 Units total) by mouth every 7 (seven) days. 4 capsule 13   No current facility-administered medications for this visit.    OBJECTIVE: Middle-aged white woman who appears stated Hull  54 Vitals:   07/09/14 0952  BP: 143/74  Pulse: 90  Temp: 97.9 F (36.6 C)  Resp: 18   Body mass index is 31.15 kg/(m^2).   ECOG FS: 0  Sclerae unicteric, pupils round and equal Oropharynx clear, good dentition No cervical or supraclavicular adenopathy Lungs no rales or rhonchi Heart regular rate and rhythm Abd soft, nontender, positive bowel sounds MSK no focal spinal tenderness, well-healed surgical scars right leg Neuro: nonfocal, well oriented, positive affect Breasts: status post bilateral mastectomies. There is no evidence of local recurrence and in particular the area in the left breast where she had the hemangioma is unchanged    LAB RESULTS: Lab Results  Component Value Date   WBC 4.5 05/22/2013   NEUTROABS 2.8 05/22/2013   HGB 13.7 05/22/2013  HCT 40.7 05/22/2013   MCV 90.2 05/22/2013   PLT 261 05/22/2013      Chemistry      Component Value Date/Time   NA 142 05/22/2013 0925   NA 139 04/23/2011 1029   K 3.9 05/22/2013 0925   K 4.0 04/23/2011 1029   CL 104 05/24/2012 1030   CL 103 04/23/2011 1029   CO2 26 05/22/2013 0925   CO2 26 04/23/2011 1029   BUN 19.9 05/22/2013 0925   BUN 18 04/23/2011 1029   CREATININE 0.7 05/22/2013 0925   CREATININE 0.81 04/23/2011 1029      Component Value Date/Time   CALCIUM 9.9 05/22/2013 0925   CALCIUM 9.9 04/23/2011 1029   ALKPHOS 89 05/22/2013 0925   ALKPHOS 101 04/23/2011 1029   AST 14 05/22/2013 0925   AST 18 04/23/2011 1029   ALT 14 05/22/2013 0925   ALT 17 04/23/2011 1029   BILITOT 0.59 05/22/2013 0925   BILITOT 0.5 04/23/2011 1029     STUDIES: No results found.  ASSESSMENT: 54 y.o. BRCA negative [place] woman with a history of  1.  Stage II nodular sclerosing Hodgkin's disease diagnosed in 1991  and treated with mantle radiation alone (no splenectomy).  She completed all treatments November 1991.  There is no evidence of disease recurrence or hypothyroidism to date.  2.  Status post bilateral mastectomies May 2010 for a right sided T2 N0, Stage IIA invasive ductal carcinoma, grade 2 , estrogen and progesterone receptor positive, HER2/neu negative, with an MIB-1 of 38%.    (a) She was treated with cyclophosphamide and docetaxel times six  (b) adjuvant radiation completed in March 2011  (c) started anastrozole March 2011, stoped December 2015  3. 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   PLAN:  Laverna is done with her anastrozole. She understands that the data End at 5 years, and currently we do not have information leading [place] to believe that continuing beyond 5 years on aromatase inhibitors is helpful or otherwise. The fact that she "jumped the gun" by 2 months or so really should make no difference  She does not have a primary care physician. She does see Dr. Sabra Heck who is functioning pretty much in that capacity. 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 The basal cell just removed from her back as an example.  Accordingly I will see her again in September and the plan will be for yearly follow-up. All I am planning to do is physical exam review of systems and lab work.   I am referring her to Dr. Collene Hull for evaluation of possible esophageal stricture, again related to her prior Hodgkin's disease treatment  Mahli knows to call for any problems that may develop before her next visit here  Chauncey Cruel, MD    07/09/2014

## 2014-07-09 NOTE — Telephone Encounter (Signed)
Gave avs & calendar for September °

## 2014-07-10 ENCOUNTER — Telehealth: Payer: Self-pay | Admitting: Oncology

## 2014-07-10 NOTE — Telephone Encounter (Signed)
Faxed pt medical records to Dr. Collene Mares [phone]

## 2014-09-18 ENCOUNTER — Encounter: Payer: Self-pay | Admitting: Genetic Counselor

## 2014-10-16 ENCOUNTER — Ambulatory Visit (INDEPENDENT_AMBULATORY_CARE_PROVIDER_SITE_OTHER): Payer: No Typology Code available for payment source | Admitting: Obstetrics & Gynecology

## 2014-10-16 ENCOUNTER — Encounter: Payer: Self-pay | Admitting: Obstetrics & Gynecology

## 2014-10-16 VITALS — BP 110/70 | HR 80 | Resp 18 | Ht 67.25 in | Wt 199.0 lb

## 2014-10-16 DIAGNOSIS — Z01419 Encounter for gynecological examination (general) (routine) without abnormal findings: Secondary | ICD-10-CM

## 2014-10-16 DIAGNOSIS — Z0000 Encounter for general adult medical examination without abnormal findings: Secondary | ICD-10-CM

## 2014-10-16 LAB — POCT URINALYSIS DIPSTICK
BILIRUBIN UA: NEGATIVE
Blood, UA: NEGATIVE
GLUCOSE UA: NEGATIVE
Ketones, UA: NEGATIVE
LEUKOCYTES UA: NEGATIVE
NITRITE UA: NEGATIVE
Protein, UA: NEGATIVE
Urobilinogen, UA: NEGATIVE
pH, UA: 6

## 2014-10-16 MED ORDER — VITAMIN D (ERGOCALCIFEROL) 1.25 MG (50000 UNIT) PO CAPS: 50000.0000 [IU] | ORAL_CAPSULE | ORAL | Status: DC

## 2014-10-16 NOTE — Progress Notes (Signed)
54 y.o. G1P1001 MarriedCaucasianF here for annual exam.  Doing well.  Finished her anastrozole.  Is going to see Dr. Jana Hakim 11/29/14.  Going to have an MRI before that time.  No vaginal bleeding.  Still seeing dermatologist every six months.  Patient's last menstrual period was 10/07/2008.          Sexually active: Yes.    The current method of family planning is post menopausal status.    Exercising: No.  The patient does not participate in regular exercise at present. Smoker:  no  Health Maintenance: Pap:  10/05/13 Neg, neg pap with neg HR HPV 2013 History of abnormal Pap:  yes MMG: 08/09/2008 MRI Bilateral Breast: BIRADS6:Proven malignancy.  Bilateral Mastectomies with reconstruction  08/2008 Colonoscopy:  07/2010 - Repeat 5 years  BMD:   07/2011  TDaP:  2009 Screening Labs: Oncology, Hb today: Oncology, Urine today: Negative   reports that she has never smoked. She has never used smokeless tobacco. She reports that she drinks about 2.4 oz of alcohol per week. She reports that she does not use illicit drugs.  Past Medical History  Diagnosis Date  . GERD (gastroesophageal reflux disease)   . Invasive ductal carcinoma of breast 8/10    ER+/PR+  . Basal cell carcinoma   . History of Hodgkin's disease 75    Past Surgical History  Procedure Laterality Date  . Tibia fracture surgery  12/11    Repair  . Lymph node biopsy    . Basal cell carcinoma excision      Removal  . Breast biopsy  5/10    Invasive ductal changes  . Breast lumpectomy Bilateral 5/10    - nodes  . Mastectomy Bilateral 08/28/08  . Laparoscopic bilateral salpingo oopherectomy  03/07/09    with implant replacement with Dr. Harlow Mares  . Portacath placement  7/10    removed 12/10    Current Outpatient Prescriptions  Medication Sig Dispense Refill  . Calcium Carbonate (CALTRATE 600 PO) Take by mouth.    . Ibuprofen-Diphenhydramine Cit (ADVIL PM PO) Take 1 tablet by mouth at bedtime as needed.    . Loratadine  (CLARITIN PO) Take by mouth.    . Multiple Vitamins-Minerals (MULTIVITAMIN PO) Take by mouth.    . Vitamin D, Ergocalciferol, (DRISDOL) 50000 UNITS CAPS capsule Take 1 capsule (50,000 Units total) by mouth every 7 (seven) days. 4 capsule 13   No current facility-administered medications for this visit.    Family History  Problem Relation Age of Onset  . Thyroid disease Mother   . Thyroid disease Father   . Hypertension Paternal Grandmother     ROS:  Pertinent items are noted in HPI.  Otherwise, a comprehensive ROS was negative.  Exam:   BP 110/70 mmHg  Pulse 80  Resp 18  Ht 5' 7.25" (1.708 m)  Wt 199 lb (90.266 kg)  BMI 30.94 kg/m2  LMP 10/07/2008  Weight change: -2#  Height: 5' 7.25" (170.8 cm)  Ht Readings from Last 3 Encounters:  10/16/14 5' 7.25" (1.708 m)  07/09/14 5' 7.5" (1.715 m)  10/05/13 5' 7.5" (1.715 m)    General appearance: alert, cooperative and appears stated age Head: Normocephalic, without obvious abnormality, atraumatic Neck: no adenopathy, supple, symmetrical, trachea midline and thyroid normal to inspection and palpation Lungs: clear to auscultation bilaterally Breasts: bilateral breast implants.  no masses.  no LAD. Heart: regular rate and rhythm Abdomen: soft, non-tender; bowel sounds normal; no masses,  no organomegaly Extremities: extremities normal, atraumatic,  no cyanosis or edema Skin: Skin color, texture, turgor normal. No rashes or lesions Lymph nodes: Cervical, supraclavicular, and axillary nodes normal. No abnormal inguinal nodes palpated Neurologic: Grossly normal   Pelvic: External genitalia:  no lesions              Urethra:  normal appearing urethra with no masses, tenderness or lesions              Bartholins and Skenes: normal                 Vagina: normal appearing vagina with normal color and discharge, no lesions              Cervix: no lesions              Pap taken: No. Bimanual Exam:  Uterus:  normal size, contour,  position, consistency, mobility, non-tender              Adnexa: no mass, fullness, tenderness               Rectovaginal: Confirms               Anus:  normal sphincter tone, no lesions  Chaperone was present for exam.  A:  Well Woman with normal exam  H/O breast cancer s/p bilateral mastectomy with implants  S/P laparoscopic BSO  H/O Hodgkin's Disease  Vit D deficiency  BCCs. Sees Dr. Delman Cheadle every six months.   P: Mammogram not done due to bilateral mastectomies. Having MRI this year before seeing Dr. Jana Hakim again. pap smear with neg HR HPV testing 07/30/11. Pap 2015.  No pap today. 50,000K IU Vit D weekly. Rx to pharmacy.  Plan BMD next year Follow up 1 year or prn

## 2014-11-19 ENCOUNTER — Telehealth: Payer: Self-pay | Admitting: Oncology

## 2014-11-19 NOTE — Telephone Encounter (Signed)
Returned Advertising account executive. Patient confirmed appointment moved from September to October

## 2014-11-23 ENCOUNTER — Other Ambulatory Visit: Payer: No Typology Code available for payment source

## 2014-11-29 ENCOUNTER — Ambulatory Visit: Payer: No Typology Code available for payment source | Admitting: Oncology

## 2014-12-14 ENCOUNTER — Other Ambulatory Visit: Payer: Self-pay

## 2014-12-17 ENCOUNTER — Telehealth: Payer: Self-pay | Admitting: Oncology

## 2014-12-17 NOTE — Telephone Encounter (Signed)
Patient called in to reschedule her appointment °

## 2014-12-28 ENCOUNTER — Other Ambulatory Visit: Payer: No Typology Code available for payment source

## 2015-01-03 ENCOUNTER — Ambulatory Visit: Payer: No Typology Code available for payment source | Admitting: Oncology

## 2015-02-06 ENCOUNTER — Telehealth: Payer: Self-pay | Admitting: Obstetrics & Gynecology

## 2015-02-06 DIAGNOSIS — E559 Vitamin D deficiency, unspecified: Secondary | ICD-10-CM

## 2015-02-06 NOTE — Telephone Encounter (Signed)
Patient calling is going to see Dr. Jana Hakim 02/13/2015, would like Dr. Sabra Heck to communicate with Dr. Jana Hakim in regards to any blood work that Dr. Sabra Heck would like for her to have done. Patient has needle phobia and would only like to be stuck once. Best # to reach: [phone]

## 2015-02-06 NOTE — Telephone Encounter (Signed)
Patient was seen for aex on 10/16/2014 with Dr.Miller. Next aex is scheduled for 01/23/2016. Patient is currently on Vitamin D 50,000 IU weekly for Vitamin D deficiency. Patient calling to see what labs Dr.Miller recommends for the patient as she is seeing Dr.Magrinat soon and only wants to be stuck once. Routing to [place] for review. Vitamin D level?

## 2015-02-07 NOTE — Telephone Encounter (Signed)
Order is placed as a future order.  They will need to release it for it to result to me.  Hopefully she won't have trouble with this.  Thanks.

## 2015-02-07 NOTE — Telephone Encounter (Signed)
Spoke with patient. Advised of message as seen below from Dr.Miller. Patient is agreeable and verbalizes understanding.  Routing to provider for final review. Patient agreeable to disposition. Will close encounter   

## 2015-02-13 ENCOUNTER — Other Ambulatory Visit: Payer: Self-pay | Admitting: *Deleted

## 2015-02-13 ENCOUNTER — Ambulatory Visit (HOSPITAL_BASED_OUTPATIENT_CLINIC_OR_DEPARTMENT_OTHER): Payer: Managed Care, Other (non HMO) | Admitting: Oncology

## 2015-02-13 ENCOUNTER — Other Ambulatory Visit (HOSPITAL_BASED_OUTPATIENT_CLINIC_OR_DEPARTMENT_OTHER): Payer: Managed Care, Other (non HMO)

## 2015-02-13 VITALS — BP 125/64 | HR 65 | Temp 98.2°F | Resp 20 | Ht 67.25 in | Wt 179.5 lb

## 2015-02-13 DIAGNOSIS — Z853 Personal history of malignant neoplasm of breast: Secondary | ICD-10-CM | POA: Diagnosis not present

## 2015-02-13 DIAGNOSIS — Z8572 Personal history of non-Hodgkin lymphomas: Secondary | ICD-10-CM | POA: Diagnosis not present

## 2015-02-13 DIAGNOSIS — Z1239 Encounter for other screening for malignant neoplasm of breast: Secondary | ICD-10-CM

## 2015-02-13 DIAGNOSIS — C50511 Malignant neoplasm of lower-outer quadrant of right female breast: Secondary | ICD-10-CM

## 2015-02-13 DIAGNOSIS — Z8571 Personal history of Hodgkin lymphoma: Secondary | ICD-10-CM

## 2015-02-13 LAB — COMPREHENSIVE METABOLIC PANEL
ALK PHOS: 81 U/L (ref 40–150)
ALT: 14 U/L (ref 0–55)
ANION GAP: 10 meq/L (ref 3–11)
AST: 18 U/L (ref 5–34)
Albumin: 4.2 g/dL (ref 3.5–5.0)
BILIRUBIN TOTAL: 0.79 mg/dL (ref 0.20–1.20)
BUN: 14.4 mg/dL (ref 7.0–26.0)
CALCIUM: 10 mg/dL (ref 8.4–10.4)
CO2: 25 meq/L (ref 22–29)
Chloride: 105 mEq/L (ref 98–109)
Creatinine: 0.8 mg/dL (ref 0.6–1.1)
EGFR: 81 mL/min/{1.73_m2} — AB (ref >90)
Glucose: 93 mg/dl (ref 70–140)
Potassium: 4 mEq/L (ref 3.5–5.1)
Sodium: 141 mEq/L (ref 136–145)
TOTAL PROTEIN: 7.1 g/dL (ref 6.4–8.3)

## 2015-02-13 LAB — CBC WITH DIFFERENTIAL/PLATELET
BASO%: 1.1 % (ref 0.0–2.0)
Basophils Absolute: 0.1 10*3/uL (ref 0.0–0.1)
EOS%: 10.7 % — ABNORMAL HIGH (ref 0.0–7.0)
Eosinophils Absolute: 0.5 10*3/uL (ref 0.0–0.5)
HCT: 41.2 % (ref 34.8–46.6)
HEMOGLOBIN: 13.9 g/dL (ref 11.6–15.9)
LYMPH%: 22.1 % (ref 14.0–49.7)
MCH: 30.6 pg (ref 25.1–34.0)
MCHC: 33.7 g/dL (ref 31.5–36.0)
MCV: 90.7 fL (ref 79.5–101.0)
MONO#: 0.4 10*3/uL (ref 0.1–0.9)
MONO%: 7.8 % (ref 0.0–14.0)
NEUT#: 2.6 10*3/uL (ref 1.5–6.5)
NEUT%: 58.3 % (ref 38.4–76.8)
PLATELETS: 222 10*3/uL (ref 145–400)
RBC: 4.54 10*6/uL (ref 3.70–5.45)
RDW: 13.1 % (ref 11.2–14.5)
WBC: 4.5 10*3/uL (ref 3.9–10.3)
lymph#: 1 10*3/uL (ref 0.9–3.3)

## 2015-02-13 LAB — LIPID PANEL
CHOLESTEROL: 208 mg/dL — AB (ref 125–200)
HDL: 68 mg/dL (ref >=46)
LDL CALC: 120 mg/dL (ref <130)
TRIGLYCERIDES: 99 mg/dL (ref <150)
Total CHOL/HDL Ratio: 3.1 Ratio (ref <=5.0)
VLDL: 20 mg/dL (ref <30)

## 2015-02-13 NOTE — Progress Notes (Signed)
ID: Denise Hull   DOB: 04/24/1960  MR#: [id number]  ZJQ#:[id number]  PCP: GYN: M. Edwinna Areola, MD SU: [name], MD;  Star Age, MD OTHER MD: Jari Pigg, MD, Jamie Kato, MD  CHIEF COMPLAINT:  Hx of Right Breast Cancer  CURRENT TREATMENT: Observation    HISTORY OF PRESENT ILLNESS: From the original intake note:  The patient had routine screening mammography Jul 11, 2008.  This showed dense breasts with benign-appearing calcifications, but a possible small mass on distortion in the upper outer quadrant of the right breast.  The patient was recalled for further studies on May 12 and this showed a 2 cm spiculated mass in the right breast, with ultrasound showing this to be hypoechoic, irregular and measuring 1.8 cm.  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    With this information, the patient was referred to Dr Marlou Starks and bilateral breast MRI was obtained on June 2.  This showed, in addition to extremely dense breasts, a 2.4 cm oval mass in the upper outer right breast, with no other masses or areas of abnormal enhancement in either breast.  There were no abnormal-appearing lymph nodes.    The patient proceeded to bilateral mastectomies (since she had mantle irradiation previously for Hodgkin disease) August 28, 2008.  The final pathology ([id number]) showed on the right the 2.2 cm invasive ductal carcinoma previously biopsied, grade 2, with negative margins, and 0 of 5 sentinel lymph nodes were involved.  On the left, there was an unsuspected focus of high-grade ductal carcinoma in situ measuring 1.3 cm and focally involving the deep margin; 5 sentinel lymph nodes were removed from the left axilla and they were all negative.  A prognostic panel for this DCIS ([id number]) showed the left-sided  DCIS to be 89% ER and 55% PR positive.    The patient's subsequent treatment history is summarized below.  INTERVAL HISTORY: Denise Hull returns today for follow-up of her estrogen receptor positive breast cancer.  The interval history is generally unremarkable. She is working on her weight , and has lost about 27 pounds, chiefly by watching what she eats. She's counting steps but not otherwise exercising. She wanted to review her original weight here which was 181 in 2009. She "stopped off" at 204", but she is now back to 180. Her goal is approximately 170.   She is very excited because she is going to become a grandma next year. Her daughter lives in [place] so that  works out well  REVIEW OF SYSTEMS:  a detailed review of systems today was otherwise noncontributory  PAST MEDICAL HISTORY: 1. 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  She underwent mantle radiation.  Did not undergo splenectomy.  The patient tells me she does not have hypothyroidism to the best of her knowledge. 2. The patient has had multiple basal cells, I believe all of them (from her description) were in the radiation field.  She is followed by Jari Pigg for this. 3. There is a history of endometrial bleeding secondary to polyps and fibroids.  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  The patient's mother is alive.  The patient has 3 brothers and 1 sister.  There is no history of breast cancer in the immediate family, although the patient's father had 2 sisters and both were diagnosed with breast cancer in  their 3s.  There is no history of ovarian cancer in the family.  GYNECOLOGIC HISTORY:  (Updated 05/29/2013)  She is GX, P1.  She delivered age 52,  just before she was diagnosed with Hodgkin disease in 16. Her periods always were  Irregular and heavy; she was on oral contraceptives to regulate this. That was discontinued at the time of her breast cancer diagnosis. She underwent bilateral oophorectomy age 9 (no hysterectomy)  SOCIAL HISTORY:  (Updated 05/29/2013) She works in  Community education officer.  Her husband Denise Hull is a Psychologist, sport and exercise for a [employer].  Their daughter Denise Hull is married and living in [place].  The patient attends [employer].   ADVANCED DIRECTIVES: in place  HEALTH MAINTENANCE:  (Updated 05/29/2013) Social History  Substance Use Topics  . Smoking status: Never Smoker   . Smokeless tobacco: Never Used  . Alcohol Use: 2.4 oz/week    4 Glasses of wine per week     Colonoscopy: May 2012 Collene Mares)  PAP: May 2012  Bone density: [facility] May 2013; osteopenia with a T score of -1.4 R fem neck  Lipid panel: March 2015, elevated  TSH: March 2015, normal     Allergies  Allergen Reactions  . Aspirin     REACTION: Nosebleeds  . Sulfonamide Derivatives Other (See Comments)    Vaginal Burning     Current Outpatient Prescriptions  Medication Sig Dispense Refill  . Calcium Carbonate (CALTRATE 600 PO) Take by mouth.    . Ibuprofen-Diphenhydramine Cit (ADVIL PM PO) Take 1 tablet by mouth at bedtime as needed.    . Loratadine (CLARITIN PO) Take by mouth.    . Multiple Vitamins-Minerals (MULTIVITAMIN PO) Take by mouth.    . Vitamin D, Ergocalciferol, (DRISDOL) 50000 UNITS CAPS capsule Take 1 capsule (50,000 Units total) by mouth every 7 (seven) days. 4 capsule 13   No current facility-administered medications for this visit.    OBJECTIVE: Middle-aged white woman in no acute distress  Filed Vitals:   02/13/15 0923  BP: 125/64  Pulse: 65  Temp: 98.2 F (36.8  C)  Resp: 20   Body mass index is 27.91 kg/(m^2).   ECOG FS: 0  Sclerae unicteric, pupils round and equal Oropharynx clear and moist-- no thrush or other lesions No cervical or supraclavicular adenopathy Lungs no rales or rhonchi Heart regular rate and rhythm Abd soft, nontender, positive bowel sounds MSK no focal spinal tenderness, no upper extremity lymphedema Neuro: nonfocal, well oriented, appropriate affect Breasts: status post bilateral mastectomies. There is no evidence of local recurrence. Both axillae are benign.  LAB RESULTS: Lab Results  Component Value Date   WBC 4.5 02/13/2015   NEUTROABS 2.6 02/13/2015   HGB 13.9 02/13/2015   HCT 41.2 02/13/2015   MCV 90.7 02/13/2015   PLT 222 02/13/2015      Chemistry      Component Value Date/Time   NA 141 02/13/2015 0855   NA 139 04/23/2011 1029   K 4.0 02/13/2015 0855   K 4.0 04/23/2011 1029   CL 104 05/24/2012 1030   CL 103 04/23/2011 1029  CO2 25 02/13/2015 0855   CO2 26 04/23/2011 1029   BUN 14.4 02/13/2015 0855   BUN 18 04/23/2011 1029   CREATININE 0.8 02/13/2015 0855   CREATININE 0.81 04/23/2011 1029      Component Value Date/Time   CALCIUM 10.0 02/13/2015 0855   CALCIUM 9.9 04/23/2011 1029   ALKPHOS 81 02/13/2015 0855   ALKPHOS 101 04/23/2011 1029   AST 18 02/13/2015 0855   AST 18 04/23/2011 1029   ALT 14 02/13/2015 0855   ALT 17 04/23/2011 1029   BILITOT 0.79 02/13/2015 0855   BILITOT 0.5 04/23/2011 1029     STUDIES: No results found.  ASSESSMENT: 54 y.o. BRCA negative [place] woman with a history of  1.  Stage II nodular sclerosing Hodgkin's disease diagnosed in 1991 and treated with mantle radiation alone (no splenectomy).  She completed all treatments November 1991.  There is no evidence of disease recurrence or hypothyroidism to date.  2.  Status post bilateral mastectomies May 2010 for a right sided T2 N0, Stage IIA invasive ductal carcinoma, grade 2 , estrogen and progesterone  receptor positive, HER2/neu negative, with an MIB-1 of 38%.    (a) She was treated with cyclophosphamide and docetaxel times six  (b) adjuvant radiation completed in March 2011  (c) started anastrozole March 2011, stoped December 2015  3. 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   PLAN:  Nyleah is doing remarkably well with regards to her history of cancer. She has a normal functional status, likes her hair the way it is now, and no longer has problems swallowing secondary to her esophageal stricture history. She has good follow-up through Dr. Sabra Heck and Dr. Delman Cheadle.  Accordingly at this point I am comfortable releasing her from follow-up here.we did discuss our new survivorship program, but she declined.  Marine understands I will be glad to see her at any point in the future if and when the need arises, but as of now we are making no further routine appointments for her here.  Chauncey Cruel, MD    02/13/2015

## 2015-08-13 ENCOUNTER — Telehealth: Payer: Self-pay | Admitting: Obstetrics & Gynecology

## 2015-08-13 NOTE — Telephone Encounter (Signed)
Call to patient. Advised per note in EPIC, last TDAP per patient history was in 2009. Patient's daughter expecting twins in July and she needs update of TDAP. Requests if we will update this for her. Last AEX 10-16-14 and scheduled for 01-2016. Moved annual exam up to 10-21-15. Advised will review with Dr Sabra Heck for order for TDAP and call her back.  Patient states she has been released from oncology.

## 2015-08-13 NOTE — Telephone Encounter (Signed)
Patient is calling to find out her last TDAP

## 2015-08-14 NOTE — Telephone Encounter (Signed)
It is fine to update the TDap now.  OK for pt to come in for this at her convenience.

## 2015-08-14 NOTE — Telephone Encounter (Signed)
Call to patient. Notified of Dr Ammie Ferrier order. Appointment scheduled for Friday 08-16-15. Encounter closed.

## 2015-08-16 ENCOUNTER — Ambulatory Visit (INDEPENDENT_AMBULATORY_CARE_PROVIDER_SITE_OTHER): Payer: Managed Care, Other (non HMO)

## 2015-08-16 VITALS — BP 128/70 | HR 76 | Wt 159.4 lb

## 2015-08-16 DIAGNOSIS — Z23 Encounter for immunization: Secondary | ICD-10-CM

## 2015-08-16 MED ORDER — TETANUS-DIPHTH-ACELL PERTUSSIS 5-2.5-18.5 LF-MCG/0.5 IM SUSP: 0.5000 mL | Freq: Once | INTRAMUSCULAR | Status: DC

## 2015-08-16 NOTE — Progress Notes (Signed)
Patient here for Tdap injection. Patient states that she will be having new grand-babies and needs tdap updated. Injection was administered in right deltoid. Patient tolerated injection well. Routing to provider and closing encounter.

## 2015-10-21 ENCOUNTER — Encounter: Payer: Self-pay | Admitting: Obstetrics & Gynecology

## 2015-10-21 ENCOUNTER — Ambulatory Visit (INDEPENDENT_AMBULATORY_CARE_PROVIDER_SITE_OTHER): Payer: Managed Care, Other (non HMO) | Admitting: Obstetrics & Gynecology

## 2015-10-21 VITALS — BP 122/66 | HR 84 | Resp 14 | Ht 67.25 in | Wt 158.8 lb

## 2015-10-21 DIAGNOSIS — Z124 Encounter for screening for malignant neoplasm of cervix: Secondary | ICD-10-CM | POA: Diagnosis not present

## 2015-10-21 DIAGNOSIS — Z01419 Encounter for gynecological examination (general) (routine) without abnormal findings: Secondary | ICD-10-CM | POA: Diagnosis not present

## 2015-10-21 DIAGNOSIS — Z0000 Encounter for general adult medical examination without abnormal findings: Secondary | ICD-10-CM | POA: Diagnosis not present

## 2015-10-21 LAB — POCT URINALYSIS DIPSTICK
BILIRUBIN UA: NEGATIVE
Blood, UA: NEGATIVE
GLUCOSE UA: NEGATIVE
KETONES UA: NEGATIVE
LEUKOCYTES UA: NEGATIVE
Nitrite, UA: NEGATIVE
PH UA: 6
Protein, UA: NEGATIVE
Urobilinogen, UA: NEGATIVE

## 2015-10-21 MED ORDER — VITAMIN D (ERGOCALCIFEROL) 1.25 MG (50000 UNIT) PO CAPS: 50000.0000 [IU] | ORAL_CAPSULE | ORAL | 13 refills | Status: DC

## 2015-10-21 NOTE — Progress Notes (Signed)
55 y.o. G54P1001 Married Caucasian F here for annual exam.  Doing well.  Has twin grandsons--Jack and Eulas Post.  So proud.    Did weight loss with her daughter through [employer].  Goal is 50 pounds and she's almost there.  Saw Dr. Stephanie Coup about having skin removed from thighs.  Has been referred to a person in [place].    Denies vaginal bleeding.   Patient's last menstrual period was 10/07/2008.          Sexually active: Yes.    The current method of family planning is post menopausal status.    Exercising: No.  The patient does not participate in regular exercise at present. Smoker:  no  Health Maintenance: Pap:  10/05/2013 negative   History of abnormal Pap:  yes MMG:  08/09/2008 BIRADS 6, Bilateral mastectomies Colonoscopy:  07/24/2010 polyps repeat 5 years BMD:   08/04/2011 osteopenia  TDaP:  08/16/2015  Pneumonia vaccine(s):  never Zostavax:   Never, has had shingles  Hep C testing: discuss with provider Screening Labs: discuss with provider, Hb today: discuss with provider, Urine today: normal    reports that she has never smoked. She has never used smokeless tobacco. She reports that she drinks about 2.4 oz of alcohol per week . She reports that she does not use drugs.  Past Medical History:  Diagnosis Date  . Basal cell carcinoma   . GERD (gastroesophageal reflux disease)   . History of Hodgkin's disease 1991  . Invasive ductal carcinoma of breast 8/10   ER+/PR+    Past Surgical History:  Procedure Laterality Date  . BASAL CELL CARCINOMA EXCISION     Removal  . BREAST BIOPSY  5/10   Invasive ductal changes  . BREAST LUMPECTOMY Bilateral 5/10   - nodes  . LAPAROSCOPIC BILATERAL SALPINGO OOPHERECTOMY  03/07/09   with implant replacement with Dr. Harlow Mares  . LYMPH NODE BIOPSY    . MASTECTOMY Bilateral 08/28/08  . PORTACATH PLACEMENT  7/10   removed 12/10  . TIBIA FRACTURE SURGERY  12/11   Repair    Current Outpatient Prescriptions  Medication Sig Dispense Refill   . Calcium Carbonate (CALTRATE 600 PO) Take by mouth.    . Ibuprofen-Diphenhydramine Cit (ADVIL PM PO) Take 1 tablet by mouth at bedtime as needed.    . Loratadine (CLARITIN PO) Take by mouth.    . Multiple Vitamins-Minerals (MULTIVITAMIN PO) Take by mouth.    . Vitamin D, Ergocalciferol, (DRISDOL) 50000 UNITS CAPS capsule Take 1 capsule (50,000 Units total) by mouth every 7 (seven) days. 4 capsule 13   No current facility-administered medications for this visit.     Family History  Problem Relation Age of Onset  . Thyroid disease Mother   . Thyroid disease Father   . Hypertension Paternal Grandmother     ROS:  Pertinent items are noted in HPI.  Otherwise, a comprehensive ROS was negative.  Exam:   Vitals:   10/21/15 1303  BP: 122/66  Pulse: 84  Resp: 14  Weight: 158 lb 12.8 oz (72 kg)  Height: 5' 7.25" (1.708 m)  Weight loss: -41#  General appearance: alert, cooperative and appears stated age Head: Normocephalic, without obvious abnormality, atraumatic Neck: no adenopathy, supple, symmetrical, trachea midline and thyroid normal to inspection and palpation Lungs: clear to auscultation bilaterally Breasts: bilateral implants, no masses, no LAD noted Heart: regular rate and rhythm Abdomen: soft, non-tender; bowel sounds normal; no masses,  no organomegaly Extremities: extremities normal, atraumatic, no cyanosis or  edema Skin: Skin color, texture, turgor normal. No rashes or lesions Lymph nodes: Cervical, supraclavicular, and axillary nodes normal. No abnormal inguinal nodes palpated Neurologic: Grossly normal   Pelvic: External genitalia:  no lesions              Urethra:  normal appearing urethra with no masses, tenderness or lesions              Bartholins and Skenes: normal                 Vagina: normal appearing vagina with normal color and discharge, no lesions              Cervix: no lesions              Pap taken: Yes.   Bimanual Exam:  Uterus:  normal size,  contour, position, consistency, mobility, non-tender              Adnexa: no mass, fullness, tenderness               Rectovaginal: Confirms               Anus:  normal sphincter tone, no lesions  Chaperone was present for exam.  A:  Well Woman with normal exam  H/O breast cancer s/p bilateral mastectomy with implants  S/P laparoscopic BSO 2010 H/O Hodgkin's Disease  Vit D deficiency  BCCs. Sees dermatologist (Dr. Delman Cheadle) every six months H/O small uterine fibroids   P: Mammogram not done due to bilateral mastectomies.  Dr. Jana Hakim and pt had discussed having a breast MRI last year.  He did not recommend doing this at last visit.   Pap and HR HPV today. 50,000K IU Vit D weekly. #4/13 RF.   Recheck Vit D next year.  If Hep C testing not done between now and then, she will have this done.   Declines blood work today D/W pt BMD testing.  Pt and I decided to wait another few years before repeating this. Follow up 1 year or prn

## 2015-10-23 LAB — IPS PAP TEST WITH HPV

## 2016-01-23 ENCOUNTER — Ambulatory Visit: Payer: No Typology Code available for payment source | Admitting: Obstetrics & Gynecology

## 2017-01-15 ENCOUNTER — Ambulatory Visit (INDEPENDENT_AMBULATORY_CARE_PROVIDER_SITE_OTHER): Payer: Managed Care, Other (non HMO) | Admitting: Obstetrics & Gynecology

## 2017-01-15 ENCOUNTER — Ambulatory Visit: Payer: Managed Care, Other (non HMO) | Admitting: Obstetrics & Gynecology

## 2017-01-15 ENCOUNTER — Other Ambulatory Visit: Payer: Self-pay

## 2017-01-15 ENCOUNTER — Encounter: Payer: Self-pay | Admitting: Obstetrics & Gynecology

## 2017-01-15 VITALS — BP 122/76 | HR 90 | Resp 16 | Ht 67.0 in | Wt 152.0 lb

## 2017-01-15 DIAGNOSIS — Z01419 Encounter for gynecological examination (general) (routine) without abnormal findings: Secondary | ICD-10-CM | POA: Diagnosis not present

## 2017-01-15 MED ORDER — VITAMIN D (ERGOCALCIFEROL) 1.25 MG (50000 UNIT) PO CAPS
50000.0000 [IU] | ORAL_CAPSULE | ORAL | 13 refills | Status: DC
Start: 2017-01-15 — End: 2018-08-05

## 2017-01-15 MED ORDER — NONFORMULARY OR COMPOUNDED ITEM: 4 refills | Status: DC

## 2017-01-15 NOTE — Progress Notes (Addendum)
56 y.o. G1P1001 MarriedCaucasianF here for annual exam.  Doing well.  Denies vaginal bleeding.  Has kept off weight from weight loss last year.    No vaginal bleeding.    Feels that right breast implant has moved more laterally.  Wonders about whether this is leaking  Patient's last menstrual period was 10/07/2008.          Sexually active: Yes.    The current method of family planning is post menopausal status.    Exercising: No Smoker:  no  Health Maintenance: Pap:  10/21/15 Neg. HR HPV:neg   10/05/13 Neg History of abnormal Pap:  yes MMG: 08/09/2008 MRI  BIRADS 6, Bilateral mastectomies Colonoscopy:  07/2010 polyps. F/u 5 years  BMD:   08/04/11 Osteopenia TDaP:  2017 Pneumonia vaccine(s):  N/A Zostavax: Had shingles Hep C testing: 8/10 Screening Labs: Will discuss   reports that  has never smoked. she has never used smokeless tobacco. She reports that she drinks about 2.4 oz of alcohol per week. She reports that she does not use drugs.  Past Medical History:  Diagnosis Date  . Basal cell carcinoma   . GERD (gastroesophageal reflux disease)   . History of Hodgkin's disease 1991  . Invasive ductal carcinoma of breast ([place]) 8/10   ER+/PR+    Past Surgical History:  Procedure Laterality Date  . BASAL CELL CARCINOMA EXCISION     Removal  . BREAST BIOPSY  5/10   Invasive ductal changes  . BREAST LUMPECTOMY Bilateral 5/10   - nodes  . LAPAROSCOPIC BILATERAL SALPINGO OOPHERECTOMY  03/07/09   with implant replacement with Dr. Harlow Mares  . LYMPH NODE BIOPSY    . MASTECTOMY Bilateral 08/28/08  . PORTACATH PLACEMENT  7/10   removed 12/10  . TIBIA FRACTURE SURGERY  12/11   Repair    Current Outpatient Medications  Medication Sig Dispense Refill  . Ibuprofen-Diphenhydramine Cit (ADVIL PM PO) Take 1 tablet by mouth at bedtime as needed.    . Loratadine (CLARITIN PO) Take by mouth.    . Triamcinolone Acetonide (NASACORT ALLERGY 24HR NA) Place into the nose.     No current  facility-administered medications for this visit.     Family History  Problem Relation Age of Onset  . Thyroid disease Mother   . Thyroid disease Father   . Hypertension Paternal Grandmother     ROS:  Pertinent items are noted in HPI.  Otherwise, a comprehensive ROS was negative.  Exam:   BP 122/76 (BP Location: Right Arm, Patient Position: Sitting, Cuff Size: Normal)   Pulse 90   Resp 16   Ht 5\' 7"  (1.702 m)   Wt 152 lb (68.9 kg)   LMP 10/07/2008   BMI 23.81 kg/m   Weight change: -6#  Height: 5\' 7"  (170.2 cm)  Ht Readings from Last 3 Encounters:  01/15/17 5\' 7"  (1.702 m)  10/21/15 5' 7.25" (1.708 m)  02/13/15 5' 7.25" (1.708 m)    General appearance: alert, cooperative and appears stated age Head: Normocephalic, without obvious abnormality, atraumatic Neck: no adenopathy, supple, symmetrical, trachea midline and thyroid normal to inspection and palpation Lungs: clear to auscultation bilaterally Breasts: Surgically absent, no masses, well healed scars, no LAD.  Bilateral implants.  Right implant more lateral. Heart: regular rate and rhythm Abdomen: soft, non-tender; bowel sounds normal; no masses,  no organomegaly Extremities: extremities normal, atraumatic, no cyanosis or edema Skin: Skin color, texture, turgor normal. No rashes or lesions Lymph nodes: Cervical, supraclavicular, and axillary nodes  normal. No abnormal inguinal nodes palpated Neurologic: Grossly normal  Pelvic: External genitalia:  no lesions              Urethra:  normal appearing urethra with no masses, tenderness or lesions              Bartholins and Skenes: normal                 Vagina: normal appearing vagina with normal color and discharge, no lesions              Cervix: no lesions              Pap taken: No. Bimanual Exam:  Uterus:  normal size, contour, position, consistency, mobility, non-tender              Adnexa: normal adnexa and no mass, fullness, tenderness               Rectovaginal:  Confirms               Anus:  normal sphincter tone, no lesions  Chaperone was present for exam.  A:  Well Woman with normal exam H/O breast cancer s/p bilateral mastectomy with impalnts S/p laparoscopic BSO 2010 H/o Hodgkin's Disease Vit D deficiency BCCs. Sees dermatologist every six month.   H/O small uterine fibroids Vaginal atrophic changes Shifted right implant  P:   Mammogram not indicated pap smear not indicated thi syear Vit D 50K weekly.  #4/12RF Plan lab work next year.  Declines today. Declines colonoscopy this year. BMD will be order Trial of Vit E vaginal suppositories.  200u/ml.  1pv three times weeky.  #12/4RF Spoke with radiology regarding imaging right breast with ultrasound.  Feel that is a good option.  Pt aware.  Order will be faxed. Return annually or prn

## 2017-01-18 ENCOUNTER — Telehealth: Payer: Self-pay | Admitting: *Deleted

## 2017-01-18 NOTE — Telephone Encounter (Signed)
-----   Message from Megan Salon, MD sent at 01/15/2017  6:34 PM EST ----- Regarding: breast ultrasound Sharee Pimple, This pt was seen on Friday.  Her right breast implant has shifted.  She is concerned about a rupture of the implant.  I called Dr. Isaiah Blakes who recommended ultrasound.  Order is on your desk.  It needs to be faxed.  Pt can call directly to [phone] to schedule.  She will talk directly with Janett Billow.  I left detailed message for pt today (Friday).  Can you call her on Monday and make sure she got the message and fax order?  Please make a phone note.  Thanks.  Vinnie Level

## 2017-01-18 NOTE — Telephone Encounter (Signed)
MMG order faxed to [facility].   Spoke with patient. Confirmed patient received message from Dr. Sabra Heck as seen below. Provided [facility] contact information and advised order has been faxed to [place]. Patient aware to call and schedule directly with [facility]. Patient verbalizes understanding and is agreeable.   Routing to provider for final review. Patient is agreeable to disposition. Will close encounter.

## 2017-07-07 ENCOUNTER — Ambulatory Visit: Payer: 59 | Admitting: Obstetrics and Gynecology

## 2017-07-07 ENCOUNTER — Other Ambulatory Visit: Payer: Self-pay

## 2017-07-07 ENCOUNTER — Encounter: Payer: Self-pay | Admitting: Obstetrics and Gynecology

## 2017-07-07 VITALS — BP 110/60 | HR 72 | Resp 14 | Ht 67.0 in | Wt 153.2 lb

## 2017-07-07 DIAGNOSIS — R319 Hematuria, unspecified: Secondary | ICD-10-CM | POA: Diagnosis not present

## 2017-07-07 DIAGNOSIS — R350 Frequency of micturition: Secondary | ICD-10-CM | POA: Diagnosis not present

## 2017-07-07 DIAGNOSIS — R3915 Urgency of urination: Secondary | ICD-10-CM

## 2017-07-07 DIAGNOSIS — R3989 Other symptoms and signs involving the genitourinary system: Secondary | ICD-10-CM

## 2017-07-07 LAB — POCT URINALYSIS DIPSTICK
BILIRUBIN UA: NEGATIVE
Glucose, UA: NEGATIVE
Ketones, UA: NEGATIVE
LEUKOCYTES UA: NEGATIVE
NITRITE UA: NEGATIVE
PH UA: 5 (ref 5.0–8.0)
PROTEIN UA: NEGATIVE

## 2017-07-07 MED ORDER — NITROFURANTOIN MONOHYD MACRO 100 MG PO CAPS: 100.0000 mg | ORAL_CAPSULE | Freq: Two times a day (BID) | ORAL | 0 refills | Status: DC

## 2017-07-07 MED ORDER — PHENAZOPYRIDINE HCL 200 MG PO TABS: 200.0000 mg | ORAL_TABLET | Freq: Three times a day (TID) | ORAL | 0 refills | Status: DC | PRN

## 2017-07-07 NOTE — Patient Instructions (Signed)

## 2017-07-07 NOTE — Progress Notes (Addendum)
GYNECOLOGY  VISIT   HPI: 57 y.o.   Married  Caucasian  female   G1P1001 with Patient's last menstrual period was 10/07/2008.   here for   UTI symptoms. Her symptoms started 10 days ago, intermittent. The first sign was some light pink in the toilet, blood when she wiped. She thinks the blood was from her bladder, but wasn't sure. She had had intercourse a couple of days before and has pain and bleeding with intercourse (always has a tinge).  As her bladder empties she feels a painful pressure in bladder. Not always sure she empties her bladder. Sometimes needs to double void, then feels empty.  She is having some urinary frequency and urgency, voiding small amounts. No leakage. She has some lower abdominal fullness, bloating. "Feels weird". Also notices "bubbles" in her urine.  No flank pain or fevers.  She has a h/o breast cancer and vaginal atrophy. She wasn't sexually active for a long time. Started being sexually active about 6 months ago. Uses a lubricant, feels like she is tearing, and typically has a small amount of bleeding after sex.   GYNECOLOGIC HISTORY: Patient's last menstrual period was 10/07/2008. Contraception: post menopausal  Menopausal hormone therapy: none         OB History    Gravida  1   Para  1   Term  1   Preterm      AB      Living  1     SAB      TAB      Ectopic      Multiple      Live Births                 Patient Active Problem List   Diagnosis Date Noted  . Breast cancer screening, high risk patient 07/09/2014  . Breast cancer of lower-outer quadrant of right female breast ([place]) 11/29/2012  . GERD 12/29/2007  . ALLERGIC RHINITIS 11/24/2007  . FIBROIDS, UTERUS 11/23/2007  . DEPRESSION, SITUATIONAL 11/23/2007  . COUGH 11/23/2007  . History of Hodgkin's disease 11/23/2007  . BASAL CELL CARCINOMA, HX OF 11/23/2007    Past Medical History:  Diagnosis Date  . Basal cell carcinoma   . GERD (gastroesophageal reflux disease)   .  History of Hodgkin's disease 1991  . 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 BASAL CELL CARCINOMA EXCISION     Removal  . BREAST BIOPSY  5/10   Invasive ductal changes  . BREAST LUMPECTOMY Bilateral 5/10   - nodes  . LAPAROSCOPIC BILATERAL SALPINGO OOPHERECTOMY  03/07/09   with implant replacement with Dr. Harlow Mares  . LYMPH NODE BIOPSY    . MASTECTOMY Bilateral 08/28/08  . PORTACATH PLACEMENT  7/10   removed 12/10  . TIBIA FRACTURE SURGERY  12/11   Repair    Current Outpatient Medications  Medication Sig Dispense Refill  . Ibuprofen-Diphenhydramine Cit (ADVIL PM PO) Take 1 tablet by mouth at bedtime as needed.    . Loratadine (CLARITIN PO) Take by mouth.    . NONFORMULARY OR COMPOUNDED ITEM Vit E vaginal suppositories 200u/ml.  One pv three times weekly. 36 each 4  . Triamcinolone Acetonide (NASACORT ALLERGY 24HR NA) Place into the nose.    . Vitamin D, Ergocalciferol, (DRISDOL) 50000 units CAPS capsule Take 1 capsule (50,000 Units total) every 7 (seven) days by mouth. 4 capsule 13   No  current facility-administered medications for this visit.      ALLERGIES: Aspirin and Sulfonamide derivatives  Family History  Problem Relation Age of Onset  . Thyroid disease Mother   . Thyroid disease Father   . Hypertension Paternal Grandmother     Social History   Socioeconomic History  . Marital status: Married    Spouse name: Not on file  . Number of children: Not on file  . Years of education: Not on file  . Highest education level: Not on file  Occupational History  . Not on file  Social Needs  . Financial resource strain: Not on file  . Food insecurity:    Worry: Not on file    Inability: Not on file  . Transportation needs:    Medical: Not on file    Non-medical: Not on file  Tobacco Use  . Smoking status: Never Smoker  . Smokeless tobacco: Never Used  Substance and Sexual Activity  .  Alcohol use: Yes    Alcohol/week: 2.4 oz    Types: 4 Glasses of wine per week  . Drug use: No  . Sexual activity: Yes    Partners: Male    Birth control/protection: Post-menopausal  Lifestyle  . Physical activity:    Days per week: Not on file    Minutes per session: Not on file  . Stress: Not on file  Relationships  . Social connections:    Talks on phone: Not on file    Gets together: Not on file    Attends religious service: Not on file    Active member of club or organization: Not on file    Attends meetings of clubs or organizations: Not on file    Relationship status: Not on file  . Intimate partner violence:    Fear of current or ex partner: Not on file    Emotionally abused: Not on file    Physically abused: Not on file    Forced sexual activity: Not on file  Other Topics Concern  . Not on file  Social History Narrative  . Not on file    Review of Systems  Constitutional: Negative.   HENT: Negative.   Eyes: Negative.   Respiratory: Negative.   Cardiovascular: Negative.   Gastrointestinal:       Abdominal bloating  Genitourinary: Positive for dysuria, frequency and urgency.  Musculoskeletal: Negative.   Skin: Negative.   Neurological: Negative.   Endo/Heme/Allergies: Negative.   Psychiatric/Behavioral: Negative.     PHYSICAL EXAMINATION:    BP 110/60 (BP Location: Right Arm, Patient Position: Sitting, Cuff Size: Normal)   Pulse 72   Resp 14   Ht 5\' 7"  (1.702 m)   Wt 153 lb 4 oz (69.5 kg)   LMP 10/07/2008   BMI 24.00 kg/m     General appearance: alert, cooperative and appears stated age Abdomen: soft, non-tender; non distended, no masses,  no organomegaly  Pelvic: External genitalia:  no lesions              Urethra:  normal appearing urethra with no masses, tenderness or lesions              Bartholins and Skenes: normal                 Vagina:atrophic appearing vagina with normal color and discharge, no lesions, no lacerations, no bleeding               Cervix: no cervical motion tenderness and no lesions  Bimanual Exam:  Uterus:  normal size, contour, position, consistency, mobility, non-tender              Adnexa: no mass, fullness, tenderness                Chaperone was present for exam.  Urine dip: 1+ blood  ASSESSMENT Urinary frequency, urgency, hematuria, intermittent bladder discomfort Dyspareunia, some tearing and spotting with intercourse. She does think that it is improving some with time    PLAN Urine for ua, c&s Will treat for presumed UTI Use lubrication with intercourse, she will f/u with Dr Sabra Heck if pain, spotting with intercourse don't continue to improve.     An After Visit Summary was printed and given to the patient.  CC: Dr Sabra Heck

## 2017-07-08 LAB — URINALYSIS, MICROSCOPIC ONLY: Casts: NONE SEEN /lpf

## 2017-07-11 ENCOUNTER — Other Ambulatory Visit: Payer: Self-pay | Admitting: Obstetrics and Gynecology

## 2017-07-11 LAB — URINE CULTURE

## 2017-07-11 MED ORDER — CEPHALEXIN 500 MG PO CAPS: 500.0000 mg | ORAL_CAPSULE | Freq: Two times a day (BID) | ORAL | 0 refills | Status: DC

## 2017-07-12 ENCOUNTER — Telehealth: Payer: Self-pay | Admitting: Obstetrics and Gynecology

## 2017-07-12 NOTE — Telephone Encounter (Signed)
Reviewed with Dr. Talbert Nan. Keflex 500 mg bid x7 days #14/0RF.   Call returned to pharmacy, spoke with Gerald Stabs. Advised as seen above per Dr. Talbert Nan. Instructions for Keflex dosing read back and confirmed.   Call returned to patient, advised as seen above.   Routing to provider for final review. Patient is agreeable to disposition. Will close encounter.

## 2017-07-12 NOTE — Telephone Encounter (Signed)
Dr. Talbert Nan left message for patient yesterday stating that medication for UTI was incorrect for type of bacteria and would be calling in a new prescription. Patient stated that pharmacy told her that there was a discrepancy in the prescription and could not fill it. Patient calling to make sure Dr. Talbert Nan is aware.

## 2017-07-12 NOTE — Telephone Encounter (Signed)
Dose: 500 mg Route: Oral Frequency: 2 times daily  Dispense Quantity: 14 capsule Refills: 0 Fills remaining: --        Sig: Take 1 capsule (500 mg total) by mouth 2 (two) times daily. Take QID for 7 days.        Keflex was written for 500 mg take 1 capsule by mouth 2 times daily. Take QID for 7 days. #14 0RF   Routing to Stewart for clarification.

## 2017-07-12 NOTE — Telephone Encounter (Signed)
Pharmacy called to check on the status of the request. She is at the pharmacy now.

## 2018-03-23 ENCOUNTER — Telehealth: Payer: Self-pay | Admitting: *Deleted

## 2018-03-23 DIAGNOSIS — Z0000 Encounter for general adult medical examination without abnormal findings: Secondary | ICD-10-CM

## 2018-03-23 NOTE — Telephone Encounter (Signed)
Last AEX 01/15/17, no labs drawn.   Dr. Sabra Heck -please advise on lab orders for AEX. Patient is scheduled for AEX on 1/31 and fasting labs on 1/17.

## 2018-03-23 NOTE — Telephone Encounter (Signed)
Patient scheduled for fasting labs on 1/17 before aex appointment. Requesting nurse to please put orders in system.

## 2018-03-25 ENCOUNTER — Other Ambulatory Visit (INDEPENDENT_AMBULATORY_CARE_PROVIDER_SITE_OTHER): Payer: 59

## 2018-03-25 DIAGNOSIS — Z0000 Encounter for general adult medical examination without abnormal findings: Secondary | ICD-10-CM

## 2018-03-26 LAB — CBC
Hematocrit: 37.8 % (ref 34.0–46.6)
Hemoglobin: 12.6 g/dL (ref 11.1–15.9)
MCH: 30.5 pg (ref 26.6–33.0)
MCHC: 33.3 g/dL (ref 31.5–35.7)
MCV: 92 fL (ref 79–97)
PLATELETS: 253 10*3/uL (ref 150–450)
RBC: 4.13 x10E6/uL (ref 3.77–5.28)
RDW: 12.3 % (ref 11.7–15.4)
WBC: 4.2 10*3/uL (ref 3.4–10.8)

## 2018-03-26 LAB — COMPREHENSIVE METABOLIC PANEL
ALT: 14 IU/L (ref 0–32)
AST: 16 IU/L (ref 0–40)
Albumin/Globulin Ratio: 2 (ref 1.2–2.2)
Albumin: 4.5 g/dL (ref 3.5–5.5)
Alkaline Phosphatase: 79 IU/L (ref 39–117)
BUN/Creatinine Ratio: 38 — ABNORMAL HIGH (ref 9–23)
BUN: 28 mg/dL — ABNORMAL HIGH (ref 6–24)
Bilirubin Total: 0.4 mg/dL (ref 0.0–1.2)
CO2: 22 mmol/L (ref 20–29)
Calcium: 9.9 mg/dL (ref 8.7–10.2)
Chloride: 98 mmol/L (ref 96–106)
Creatinine, Ser: 0.73 mg/dL (ref 0.57–1.00)
GFR calc Af Amer: 105 mL/min/{1.73_m2} (ref >59)
GFR calc non Af Amer: 91 mL/min/{1.73_m2} (ref >59)
Globulin, Total: 2.2 g/dL (ref 1.5–4.5)
Glucose: 84 mg/dL (ref 65–99)
Potassium: 4.1 mmol/L (ref 3.5–5.2)
Sodium: 137 mmol/L (ref 134–144)
Total Protein: 6.7 g/dL (ref 6.0–8.5)

## 2018-03-26 LAB — LIPID PANEL
Chol/HDL Ratio: 2.8 ratio (ref 0.0–4.4)
Cholesterol, Total: 231 mg/dL — ABNORMAL HIGH (ref 100–199)
HDL: 83 mg/dL (ref >39)
LDL CALC: 134 mg/dL — AB (ref 0–99)
Triglycerides: 69 mg/dL (ref 0–149)
VLDL Cholesterol Cal: 14 mg/dL (ref 5–40)

## 2018-03-26 LAB — VITAMIN D 25 HYDROXY (VIT D DEFICIENCY, FRACTURES): Vit D, 25-Hydroxy: 20.2 ng/mL — ABNORMAL LOW (ref 30.0–100.0)

## 2018-03-26 LAB — TSH: TSH: 5.48 u[IU]/mL — ABNORMAL HIGH (ref 0.450–4.500)

## 2018-04-01 ENCOUNTER — Other Ambulatory Visit: Payer: Managed Care, Other (non HMO)

## 2018-04-07 NOTE — Progress Notes (Signed)
58 y.o. G24P1001 Married White or Caucasian female here for annual exam.  Doing well.  Grandsons are doing really well.    Denies vaginal bleeding.  D/w pt blood work that was done a few weeks ago.  TSH was mildly elevated.  She is going to return for blood work as she does not want to do this today.    Denies vaginal bleeding.  Has experienced a few UTIs this year.  Always after intercorues when this occurs.  Last year, she had concerns about location of right breast implant and concerns it was leaking.  Ultrasound was ordered.  Reports she played phone tag a few times but never ended up getting appt scheduled.  Patient's last menstrual period was 10/07/2008.          Sexually active: Yes.    The current method of family planning is post menopausal status.    Exercising: Yes.    walking, elliptical, bike, light weights  Smoker:  no  Health Maintenance: Pap:  10/21/15 Neg. HR HPV:Neg   10/05/13 Neg  History of abnormal Pap:  yes MMG:  08/09/2008 MRI. Bilateral mastectomy  Colonoscopy:  07/24/10. Polyps. F/u 5 years. BMD:   08/04/11 Osteopenia TDaP:  2017 Pneumonia vaccine(s):  no Shingrix:   Not sure she wants this Hep C testing: 10/09/08 neg  Screening Labs: 03/25/18 fasting labs    reports that she has never smoked. She has never used smokeless tobacco. She reports current alcohol use of about 4.0 standard drinks of alcohol per week. She reports that she does not use drugs.  Past Medical History:  Diagnosis Date  . Basal cell carcinoma   . GERD (gastroesophageal reflux disease)   . History of Hodgkin's disease 1991  . 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 BASAL CELL CARCINOMA EXCISION     Removal  . BREAST BIOPSY  5/10   Invasive ductal changes  . BREAST LUMPECTOMY Bilateral 5/10   - nodes  . LAPAROSCOPIC BILATERAL SALPINGO OOPHERECTOMY  03/07/09   with implant replacement with Dr. Harlow Mares  . LYMPH NODE  BIOPSY    . MASTECTOMY Bilateral 08/28/08  . PORTACATH PLACEMENT  7/10   removed 12/10  . TIBIA FRACTURE SURGERY  12/11   Repair    Current Outpatient Medications  Medication Sig Dispense Refill  . Vaginal Lubricant (REPLENS) GEL Place vaginally.    . Vitamin D, Ergocalciferol, (DRISDOL) 50000 units CAPS capsule Take 1 capsule (50,000 Units total) every 7 (seven) days by mouth. 4 capsule 13  . Ibuprofen-Diphenhydramine Cit (ADVIL PM PO) Take 1 tablet by mouth at bedtime as needed.    . Loratadine (CLARITIN PO) Take by mouth.    . Triamcinolone Acetonide (NASACORT ALLERGY 24HR NA) Place into the nose.     No current facility-administered medications for this visit.     Family History  Problem Relation Age of Onset  . Thyroid disease Mother   . Thyroid disease Father   . Hypertension Paternal Grandmother     Review of Systems  All other systems reviewed and are negative.   Exam:   BP 110/70 (BP Location: Right Arm, Patient Position: Sitting, Cuff Size: Normal)   Pulse 80   Ht 5' 7.25" (1.708 m)   Wt 151 lb (68.5 kg)   LMP 10/07/2008   BMI 23.47 kg/m    Height: 5' 7.25" (170.8 cm)  Ht Readings from Last 3  Encounters:  04/08/18 5' 7.25" (1.708 m)  07/07/17 5\' 7"  (1.702 m)  01/15/17 5\' 7"  (1.702 m)    General appearance: alert, cooperative and appears stated age Head: Normocephalic, without obvious abnormality, atraumatic Neck: no adenopathy, supple, symmetrical, trachea midline and thyroid normal to inspection and palpation Lungs: clear to auscultation bilaterally Breasts: s/p mastectomy with bilateral implants, no masses, LAD noted.  Well healed scars noted. Heart: regular rate and rhythm Abdomen: soft, non-tender; bowel sounds normal; no masses,  no organomegaly Extremities: extremities normal, atraumatic, no cyanosis or edema Skin: Skin color, texture, turgor normal. No rashes or lesions Lymph nodes: Cervical, supraclavicular, and axillary nodes normal. No abnormal  inguinal nodes palpated Neurologic: Grossly normal  Pelvic: External genitalia:  no lesions              Urethra:  normal appearing urethra with no masses, tenderness or lesions              Bartholins and Skenes: normal                 Vagina: normal appearing vagina with normal color and discharge, no lesions              Cervix: no lesions              Pap taken: Yes.   Bimanual Exam:  Uterus:  normal size, contour, position, consistency, mobility, non-tender              Adnexa: normal adnexa and no mass, fullness, tenderness               Rectovaginal: Confirms               Anus:  normal sphincter tone, no lesions  Chaperone was present for exam.  A:  Well Woman with normal exam PMP, no HRT H/O recurrent UTIs this past year H/O breast cancer s/p bilateral mastecotmy with implants S/p laparoscopic BSO H/o Low Vit D--takes supplement H/O Hodgkin's Disease H/o small uterine fibroids Vaginal atrophic changes Elevated TSH with screening lab work Mildly elevated total cholesterol and LDLs.  ACC/AHA risk is 1.6%.  P:   Mammogram not indicated however will still try and get ultrasound scheduled for pt.  Wed and Thursdays work best. Will plan BMD this year as well pap smear obtained today Release of records from [place] signed today.  I'd like to see the urine cultures from this past year. Trial of macrobid 100mg  po 30 minutes after intercourse.  #30/1RF RF for Xanax 0.5mg  po x 1 before procedure and repeat in 1 year if needed.  #10/0RF Aware should do colonoscopy and not cologuard due to hx of polyps.  IFOB given today. Return for repeat TSH, free T4 and T3. return annually or prn

## 2018-04-08 ENCOUNTER — Ambulatory Visit (INDEPENDENT_AMBULATORY_CARE_PROVIDER_SITE_OTHER): Payer: 59 | Admitting: Obstetrics & Gynecology

## 2018-04-08 ENCOUNTER — Encounter: Payer: Self-pay | Admitting: Obstetrics & Gynecology

## 2018-04-08 ENCOUNTER — Other Ambulatory Visit (HOSPITAL_COMMUNITY)
Admission: RE | Admit: 2018-04-08 | Discharge: 2018-04-08 | Disposition: A | Discharge status: Home / Self Care | Payer: 59 | Source: Ambulatory Visit | Attending: Obstetrics & Gynecology | Admitting: Obstetrics & Gynecology

## 2018-04-08 ENCOUNTER — Other Ambulatory Visit: Payer: Self-pay

## 2018-04-08 VITALS — BP 110/70 | HR 80 | Ht 67.25 in | Wt 151.0 lb

## 2018-04-08 DIAGNOSIS — R7989 Other specified abnormal findings of blood chemistry: Secondary | ICD-10-CM

## 2018-04-08 DIAGNOSIS — Z124 Encounter for screening for malignant neoplasm of cervix: Secondary | ICD-10-CM

## 2018-04-08 DIAGNOSIS — Z01419 Encounter for gynecological examination (general) (routine) without abnormal findings: Secondary | ICD-10-CM

## 2018-04-08 DIAGNOSIS — Z0000 Encounter for general adult medical examination without abnormal findings: Secondary | ICD-10-CM | POA: Diagnosis not present

## 2018-04-08 DIAGNOSIS — Z1211 Encounter for screening for malignant neoplasm of colon: Secondary | ICD-10-CM

## 2018-04-08 LAB — POCT URINALYSIS DIPSTICK
Bilirubin, UA: NEGATIVE
Blood, UA: NEGATIVE
Glucose, UA: NEGATIVE
KETONES UA: NEGATIVE
Leukocytes, UA: NEGATIVE
Nitrite, UA: NEGATIVE
Protein, UA: NEGATIVE
Urobilinogen, UA: 0.2 E.U./dL
pH, UA: 7 (ref 5.0–8.0)

## 2018-04-08 MED ORDER — ALPRAZOLAM 0.5 MG PO TABS: 0.5000 mg | ORAL_TABLET | Freq: Every evening | ORAL | 0 refills | Status: DC | PRN

## 2018-04-08 MED ORDER — NITROFURANTOIN MONOHYD MACRO 100 MG PO CAPS: ORAL_CAPSULE | ORAL | 1 refills | Status: DC

## 2018-04-08 NOTE — Patient Instructions (Signed)
Denise Hull, Armed forces logistics/support/administrative officer at [employer]

## 2018-04-11 LAB — CYTOLOGY - PAP: DIAGNOSIS: NEGATIVE

## 2018-08-05 ENCOUNTER — Other Ambulatory Visit: Payer: Self-pay | Admitting: Obstetrics & Gynecology

## 2018-08-05 NOTE — Telephone Encounter (Signed)
Medication refill request: vitamin d 50,000iu Last AEX:  04-08-2018 Next AEX: 08-15-2019 Last MMG (if hormonal medication request): n/a Refill authorized: please approve if appropriate.

## 2019-03-15 ENCOUNTER — Ambulatory Visit: Payer: Self-pay | Attending: Internal Medicine

## 2019-07-03 NOTE — Progress Notes (Signed)
59 y.o. G2P1001 Married White or Caucasian female here for annual exam.  Doing well.  Has been vaccinated.  Got the first Moderna in [place] and then her second one here.    Denies vaginal bleeding.  Hasn't had issues with UTIs this year.   Frustrated with 15# weight gain.    Patient's last menstrual period was 10/07/2008.          Sexually active: Yes.    The current method of family planning is post menopausal status.    Exercising: No.  exercise Smoker:  no  Health Maintenance: Pap:  10-21-15 neg HPV HR neg, 04-08-2018 neg History of abnormal Pap:  yes MMG:  08-09-2008 MRI, bilateral mastectomy Colonoscopy:  07-24-10 polyps f/u 5 yrs.  Pt is aware this is overdue. BMD:  08-04-11 osteopenia TDaP:  2017 Pneumonia vaccine(s):  Not done Shingrix:   Not done  Hep C testing: not done Screening Labs: will try and plan this year   reports that she has never smoked. She has never used smokeless tobacco. She reports current alcohol use of about 4.0 standard drinks of alcohol per week. She reports that she does not use drugs.  Past Medical History:  Diagnosis Date  . Abnormal Pap smear of cervix   . Basal cell carcinoma   . GERD (gastroesophageal reflux disease)   . History of Hodgkin's disease 1991  . 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 BASAL CELL CARCINOMA EXCISION     Removal  . BREAST BIOPSY  5/10   Invasive ductal changes  . BREAST LUMPECTOMY Bilateral 5/10   - nodes  . LAPAROSCOPIC BILATERAL SALPINGO OOPHERECTOMY  03/07/09   with implant replacement with Dr. Harlow Mares  . LYMPH NODE BIOPSY    . MASTECTOMY Bilateral 08/28/08  . PORTACATH PLACEMENT  7/10   removed 12/10  . TIBIA FRACTURE SURGERY  12/11   Repair    Current Outpatient Medications  Medication Sig Dispense Refill  . cetirizine (ZYRTEC) 10 MG tablet Take 10 mg by mouth daily.    . Ibuprofen-Diphenhydramine Cit (ADVIL PM PO) Take 1 tablet by  mouth at bedtime as needed.    . Triamcinolone Acetonide (NASACORT ALLERGY 24HR NA) Place into the nose.    . Vitamin D, Ergocalciferol, (DRISDOL) 1.25 MG (50000 UT) CAPS capsule TAKE ONE CAPSULE BY MOUTH ONCE WEEKLY 4 capsule 11  . ALPRAZolam (XANAX) 0.5 MG tablet Take 1 tablet (0.5 mg total) by mouth at bedtime as needed for anxiety. (Patient not taking: Reported on 07/06/2019) 10 tablet 0   No current facility-administered medications for this visit.    Family History  Problem Relation Age of Onset  . Thyroid disease Mother   . Thyroid disease Father   . Hypertension Paternal Grandmother     Review of Systems  Constitutional: Negative.   HENT: Negative.   Eyes: Negative.   Respiratory: Negative.   Cardiovascular: Negative.   Gastrointestinal: Negative.   Endocrine: Negative.   Genitourinary: Negative.   Musculoskeletal: Negative.   Skin: Negative.   Allergic/Immunologic: Negative.   Neurological: Negative.   Psychiatric/Behavioral: Negative.     Exam:   BP 116/80   Pulse 70   Temp (!) 97.2 F (36.2 C) (Skin)   Resp 16   Ht 5' 8.25" (1.734 m)   Wt 166 lb (75.3 kg)   LMP 10/07/2008   BMI 25.06 kg/m   Height: 5' 8.25" (173.4  cm)  General appearance: alert, cooperative and appears stated age Head: Normocephalic, without obvious abnormality, atraumatic Neck: no adenopathy, supple, symmetrical, trachea midline and thyroid normal to inspection and palpation Lungs: clear to auscultation bilaterally Breasts: bilateral implants with well healed incisions, no axillary LAD Heart: regular rate and rhythm Abdomen: soft, non-tender; bowel sounds normal; no masses,  no organomegaly Extremities: extremities normal, atraumatic, no cyanosis or edema Skin: Skin color, texture, turgor normal. No rashes or lesions Lymph nodes: Cervical, supraclavicular, and axillary nodes normal. No abnormal inguinal nodes palpated Neurologic: Grossly normal   Pelvic: External genitalia:  no  lesions              Urethra:  normal appearing urethra with no masses, tenderness or lesions              Bartholins and Skenes: normal                 Vagina: normal appearing vagina with normal color and discharge, no lesions              Cervix: no lesions              Pap taken: Yes.   Bimanual Exam:  Uterus:  normal size, contour, position, consistency, mobility, non-tender              Adnexa: no mass, fullness, tenderness               Rectovaginal: Confirms               Anus:  normal sphincter tone, no lesions  Chaperone,  Terence Lux, CMA, was present for exam.  A:  Well Woman with normal exam PMP, no HRT H/o breast cancer s/p bilateral mastectomy with implants S/p laparoscopic BSO Vit D deficiency, on supplements H/o hodgkin's lymphoma H/o small uterine fibroids Mildly elevated total cholesterol and LDLs  P:   Mammogram not indicated due to bilateral mastectomy Pap smear neg with neg HR HPV Pt aware colonoscopy is due BMD ordered this year Referral for colonoscopy will be made.  She does have an aversion to have and IV Future lab orders placed for CBC, TSH and free T4, CMP, Vit D, and lipids.  She will do these fasting.   Does not need RF for macrobid RF for Vit D 50K, one capsule weekly.  #12/4RF. Return annually or prn

## 2019-07-05 ENCOUNTER — Other Ambulatory Visit: Payer: Self-pay

## 2019-07-06 ENCOUNTER — Telehealth: Payer: Self-pay | Admitting: *Deleted

## 2019-07-06 ENCOUNTER — Ambulatory Visit: Payer: 59 | Admitting: Obstetrics & Gynecology

## 2019-07-06 ENCOUNTER — Other Ambulatory Visit (HOSPITAL_COMMUNITY)
Admission: RE | Admit: 2019-07-06 | Discharge: 2019-07-06 | Disposition: A | Discharge status: Home / Self Care | Payer: 59 | Source: Ambulatory Visit | Attending: Obstetrics & Gynecology | Admitting: Obstetrics & Gynecology

## 2019-07-06 ENCOUNTER — Encounter: Payer: Self-pay | Admitting: Obstetrics & Gynecology

## 2019-07-06 ENCOUNTER — Other Ambulatory Visit: Payer: Self-pay

## 2019-07-06 VITALS — BP 116/80 | HR 70 | Temp 97.2°F | Resp 16 | Ht 68.25 in | Wt 166.0 lb

## 2019-07-06 DIAGNOSIS — E2839 Other primary ovarian failure: Secondary | ICD-10-CM | POA: Diagnosis not present

## 2019-07-06 DIAGNOSIS — Z0000 Encounter for general adult medical examination without abnormal findings: Secondary | ICD-10-CM | POA: Diagnosis not present

## 2019-07-06 DIAGNOSIS — Z01419 Encounter for gynecological examination (general) (routine) without abnormal findings: Secondary | ICD-10-CM | POA: Diagnosis not present

## 2019-07-06 DIAGNOSIS — Z124 Encounter for screening for malignant neoplasm of cervix: Secondary | ICD-10-CM | POA: Insufficient documentation

## 2019-07-06 DIAGNOSIS — E559 Vitamin D deficiency, unspecified: Secondary | ICD-10-CM

## 2019-07-06 MED ORDER — VITAMIN D (ERGOCALCIFEROL) 1.25 MG (50000 UNIT) PO CAPS: 50000.0000 [IU] | ORAL_CAPSULE | ORAL | 4 refills | Status: DC

## 2019-07-06 NOTE — Telephone Encounter (Signed)
-----   Message from Megan Salon, MD sent at 07/06/2019 11:05 AM EDT ----- Regarding: colonoscopy Pt has hx of colon polyps and is very overdue for this.  She saw Dr. Collene Mares in the past.  Can you call to make appt for pt?  Could you make appt for late June?  Thanks.

## 2019-07-06 NOTE — Telephone Encounter (Signed)
Spoke with Butch Penny at [facility]. Patient scheduled for consult on 08/31/19 at 8:30am with Dr. Collene Mares, will then schedule colonoscopy.

## 2019-07-06 NOTE — Telephone Encounter (Signed)
Call to patient, left detailed message, ok per dpr. Advised of appt details as seen below. Advised this is a consult, colonoscopy will be discussed and scheduled. If any changes need to be made to appt, contact their office directly at [phone]. Return call to [place], RN at [facility] if any additional questions.   Routing to provider for final review. Patient is agreeable to disposition. Will close encounter.

## 2019-07-07 LAB — CYTOLOGY - PAP
Comment: NEGATIVE
Diagnosis: NEGATIVE
High risk HPV: NEGATIVE

## 2019-08-15 ENCOUNTER — Ambulatory Visit: Payer: 59 | Admitting: Obstetrics & Gynecology

## 2019-10-26 ENCOUNTER — Encounter: Payer: Self-pay | Admitting: Genetic Counselor

## 2020-08-06 DIAGNOSIS — L821 Other seborrheic keratosis: Secondary | ICD-10-CM | POA: Diagnosis not present

## 2020-08-06 DIAGNOSIS — B078 Other viral warts: Secondary | ICD-10-CM | POA: Diagnosis not present

## 2020-08-06 DIAGNOSIS — L570 Actinic keratosis: Secondary | ICD-10-CM | POA: Diagnosis not present

## 2020-08-06 DIAGNOSIS — D225 Melanocytic nevi of trunk: Secondary | ICD-10-CM | POA: Diagnosis not present

## 2020-08-06 DIAGNOSIS — L720 Epidermal cyst: Secondary | ICD-10-CM | POA: Diagnosis not present

## 2020-08-06 DIAGNOSIS — L578 Other skin changes due to chronic exposure to nonionizing radiation: Secondary | ICD-10-CM | POA: Diagnosis not present

## 2020-09-16 ENCOUNTER — Ambulatory Visit: Payer: 59

## 2021-02-05 DIAGNOSIS — C44519 Basal cell carcinoma of skin of other part of trunk: Secondary | ICD-10-CM | POA: Diagnosis not present

## 2021-02-05 DIAGNOSIS — D225 Melanocytic nevi of trunk: Secondary | ICD-10-CM | POA: Diagnosis not present

## 2021-02-05 DIAGNOSIS — L719 Rosacea, unspecified: Secondary | ICD-10-CM | POA: Diagnosis not present

## 2021-02-05 DIAGNOSIS — D485 Neoplasm of uncertain behavior of skin: Secondary | ICD-10-CM | POA: Diagnosis not present

## 2021-02-05 DIAGNOSIS — L578 Other skin changes due to chronic exposure to nonionizing radiation: Secondary | ICD-10-CM | POA: Diagnosis not present

## 2021-02-05 DIAGNOSIS — L821 Other seborrheic keratosis: Secondary | ICD-10-CM | POA: Diagnosis not present

## 2021-02-05 DIAGNOSIS — B078 Other viral warts: Secondary | ICD-10-CM | POA: Diagnosis not present

## 2021-03-26 DIAGNOSIS — C44519 Basal cell carcinoma of skin of other part of trunk: Secondary | ICD-10-CM | POA: Diagnosis not present

## 2021-03-27 DIAGNOSIS — B9789 Other viral agents as the cause of diseases classified elsewhere: Secondary | ICD-10-CM | POA: Diagnosis not present

## 2021-03-27 DIAGNOSIS — R509 Fever, unspecified: Secondary | ICD-10-CM | POA: Diagnosis not present

## 2021-03-27 DIAGNOSIS — Z03818 Encounter for observation for suspected exposure to other biological agents ruled out: Secondary | ICD-10-CM | POA: Diagnosis not present

## 2021-03-27 DIAGNOSIS — J988 Other specified respiratory disorders: Secondary | ICD-10-CM | POA: Diagnosis not present

## 2021-03-27 DIAGNOSIS — J0100 Acute maxillary sinusitis, unspecified: Secondary | ICD-10-CM | POA: Diagnosis not present

## 2021-08-27 DIAGNOSIS — L578 Other skin changes due to chronic exposure to nonionizing radiation: Secondary | ICD-10-CM | POA: Diagnosis not present

## 2021-08-27 DIAGNOSIS — L719 Rosacea, unspecified: Secondary | ICD-10-CM | POA: Diagnosis not present

## 2021-08-27 DIAGNOSIS — L821 Other seborrheic keratosis: Secondary | ICD-10-CM | POA: Diagnosis not present

## 2021-08-27 DIAGNOSIS — L570 Actinic keratosis: Secondary | ICD-10-CM | POA: Diagnosis not present

## 2021-08-27 DIAGNOSIS — D225 Melanocytic nevi of trunk: Secondary | ICD-10-CM | POA: Diagnosis not present

## 2021-10-14 DIAGNOSIS — J209 Acute bronchitis, unspecified: Secondary | ICD-10-CM | POA: Diagnosis not present

## 2021-12-22 ENCOUNTER — Other Ambulatory Visit: Payer: Self-pay

## 2021-12-22 ENCOUNTER — Emergency Department (HOSPITAL_BASED_OUTPATIENT_CLINIC_OR_DEPARTMENT_OTHER): Payer: BC Managed Care – PPO

## 2021-12-22 ENCOUNTER — Emergency Department (HOSPITAL_BASED_OUTPATIENT_CLINIC_OR_DEPARTMENT_OTHER): Payer: BC Managed Care – PPO | Admitting: Radiology

## 2021-12-22 ENCOUNTER — Emergency Department (HOSPITAL_BASED_OUTPATIENT_CLINIC_OR_DEPARTMENT_OTHER)
Admission: EM | Admit: 2021-12-22 | Discharge: 2021-12-23 | Disposition: A | Discharge status: Home / Self Care | Payer: BC Managed Care – PPO | Attending: Emergency Medicine | Admitting: Emergency Medicine

## 2021-12-22 ENCOUNTER — Encounter (HOSPITAL_BASED_OUTPATIENT_CLINIC_OR_DEPARTMENT_OTHER): Payer: Self-pay

## 2021-12-22 DIAGNOSIS — J189 Pneumonia, unspecified organism: Secondary | ICD-10-CM | POA: Insufficient documentation

## 2021-12-22 DIAGNOSIS — R0602 Shortness of breath: Secondary | ICD-10-CM | POA: Diagnosis not present

## 2021-12-22 DIAGNOSIS — Z20822 Contact with and (suspected) exposure to covid-19: Secondary | ICD-10-CM | POA: Diagnosis not present

## 2021-12-22 DIAGNOSIS — R079 Chest pain, unspecified: Secondary | ICD-10-CM | POA: Diagnosis not present

## 2021-12-22 DIAGNOSIS — R0789 Other chest pain: Secondary | ICD-10-CM | POA: Diagnosis not present

## 2021-12-22 DIAGNOSIS — Z853 Personal history of malignant neoplasm of breast: Secondary | ICD-10-CM | POA: Diagnosis not present

## 2021-12-22 LAB — BASIC METABOLIC PANEL
Anion gap: 13 (ref 5–15)
BUN: 16 mg/dL (ref 8–23)
CO2: 22 mmol/L (ref 22–32)
Calcium: 9.6 mg/dL (ref 8.9–10.3)
Chloride: 99 mmol/L (ref 98–111)
Creatinine, Ser: 0.74 mg/dL (ref 0.44–1.00)
GFR, Estimated: >60 mL/min (ref >60)
Glucose, Bld: 221 mg/dL — ABNORMAL HIGH (ref 70–99)
Potassium: 3.7 mmol/L (ref 3.5–5.1)
Sodium: 134 mmol/L — ABNORMAL LOW (ref 135–145)

## 2021-12-22 LAB — CBC
HCT: 39.7 % (ref 36.0–46.0)
Hemoglobin: 13.3 g/dL (ref 12.0–15.0)
MCH: 30.2 pg (ref 26.0–34.0)
MCHC: 33.5 g/dL (ref 30.0–36.0)
MCV: 90.2 fL (ref 80.0–100.0)
Platelets: 248 10*3/uL (ref 150–400)
RBC: 4.4 MIL/uL (ref 3.87–5.11)
RDW: 13.2 % (ref 11.5–15.5)
WBC: 10.9 10*3/uL — ABNORMAL HIGH (ref 4.0–10.5)
nRBC: 0 % (ref 0.0–0.2)

## 2021-12-22 LAB — TROPONIN I (HIGH SENSITIVITY)
Troponin I (High Sensitivity): <2 ng/L (ref <18)
Troponin I (High Sensitivity): 4 ng/L (ref <18)

## 2021-12-22 MED ORDER — IOHEXOL 350 MG/ML SOLN
100.0000 mL | Freq: Once | INTRAVENOUS | Status: AC | PRN
  Administered 2021-12-22: 75 mL via INTRAVENOUS

## 2021-12-22 MED ORDER — KETOROLAC TROMETHAMINE 30 MG/ML IJ SOLN
30.0000 mg | Freq: Once | INTRAMUSCULAR | Status: AC
  Administered 2021-12-23: 30 mg via INTRAVENOUS
  Filled 2021-12-22: qty 1

## 2021-12-22 MED ORDER — SODIUM CHLORIDE 0.9 % IV BOLUS
1000.0000 mL | Freq: Once | INTRAVENOUS | Status: AC
  Administered 2021-12-23: 1000 mL via INTRAVENOUS

## 2021-12-22 NOTE — ED Notes (Signed)
Call to lab to run resp panel on swab which was sent

## 2021-12-22 NOTE — ED Provider Notes (Signed)
Denise Hull   CSN: [id number] Arrival date & time: 12/22/21  1921     History  Chief Complaint  Patient presents with   Chest Pain   Shortness of Breath    Denise Hull is a 61 y.o. female.  Patient is a 61 year old female with past medical history of non-Hodgkin's lymphoma treated 30 years ago, breast cancer treated in 2010, but no other medical history.  Patient presenting today with complaints of right-sided chest pain.  This started Saturday night in the absence of any injury or trauma.  She describes a constant ache to the right side of her chest that is worse when she lies flat, coughs, or breathes.  She denies any productive cough.  She denies fever at home, but arrives here febrile with a temp of 101.  She denies any leg pain or swelling.  She denies any recent travel or ill contacts.  The history is provided by the patient.  Chest Pain Pain location:  R lateral chest Pain quality: sharp   Pain radiates to:  Does not radiate Pain severity:  Moderate Onset quality:  Sudden Duration:  2 days Timing:  Constant Progression:  Worsening Associated symptoms: shortness of breath   Shortness of Breath Associated symptoms: chest pain        Home Medications Prior to Admission medications   Medication Sig Start Date End Date Taking? Authorizing Provider  ALPRAZolam Duanne Moron) 0.5 MG tablet Take 1 tablet (0.5 mg total) by mouth at bedtime as needed for anxiety. Patient not taking: Reported on 07/06/2019 04/08/18   Megan Salon, MD  cetirizine (ZYRTEC) 10 MG tablet Take 10 mg by mouth daily.    [provider]  Ibuprofen-Diphenhydramine Cit (ADVIL PM PO) Take 1 tablet by mouth at bedtime as needed.    [provider]  Triamcinolone Acetonide (NASACORT ALLERGY 24HR NA) Place into the nose.    [provider]  Vitamin D, Ergocalciferol, (DRISDOL) 1.25 MG (50000 UNIT) CAPS capsule Take 1 capsule  (50,000 Units total) by mouth once a week. 07/06/19   Megan Salon, MD      Allergies    Aspirin and Sulfonamide derivatives    Review of Systems   Review of Systems  Respiratory:  Positive for shortness of breath.   Cardiovascular:  Positive for chest pain.  All other systems reviewed and are negative.   Physical Exam Updated Vital Signs BP (!) 152/76   Pulse 96   Temp 99.5 F (37.5 C) (Oral)   Resp 15   Ht '5\' 7"'$  (1.702 m)   Wt 87.5 kg   LMP 10/07/2008   SpO2 96%   BMI 30.23 kg/m  Physical Exam Vitals and nursing Hull reviewed.  Constitutional:      General: She is not in acute distress.    Appearance: She is well-developed. She is not diaphoretic.  HENT:     Head: Normocephalic and atraumatic.  Cardiovascular:     Rate and Rhythm: Normal rate and regular rhythm.     Heart sounds: No murmur heard.    No friction rub. No gallop.  Pulmonary:     Effort: Pulmonary effort is normal. No respiratory distress.     Breath sounds: Normal breath sounds. No wheezing.  Abdominal:     General: Bowel sounds are normal. There is no distension.     Palpations: Abdomen is soft.     Tenderness: There is no abdominal tenderness.  Musculoskeletal:  General: Normal range of motion.     Cervical back: Normal range of motion and neck supple.     Right lower leg: No tenderness. No edema.     Left lower leg: No tenderness. No edema.     Comments: Bevelyn Buckles' sign is absent bilaterally.  Skin:    General: Skin is warm and dry.  Neurological:     General: No focal deficit present.     Mental Status: She is alert and oriented to person, place, and time.     ED Results / Procedures / Treatments   Labs (all labs ordered are listed, but only abnormal results are displayed) Labs Reviewed  BASIC METABOLIC PANEL - Abnormal; Notable for the following components:      Result Value   Sodium 134 (*)    Glucose, Bld 221 (*)    All other components within normal limits  CBC - Abnormal;  Notable for the following components:   WBC 10.9 (*)    All other components within normal limits  RESP PANEL BY RT-PCR (FLU A&B, COVID) ARPGX2  TROPONIN I (HIGH SENSITIVITY)  TROPONIN I (HIGH SENSITIVITY)    EKG None  Radiology DG Chest 2 View  Result Date: 12/22/2021 CLINICAL DATA:  Chest pain EXAM: CHEST - 2 VIEW COMPARISON:  12/22/2021 at 1806 hours FINDINGS: Lungs are clear.  No pleural effusion or pneumothorax. Heart is normal in size.  Thoracic aortic atherosclerosis. Mild degenerative changes of the visualized thoracolumbar spine. Surgical clips along the right chest wall/axilla. IMPRESSION: Normal chest radiographs. Electronically Signed   By: Julian Hy M.D.   On: 12/22/2021 20:07    Procedures Procedures  {Document cardiac monitor, telemetry assessment procedure when appropriate:1}  Medications Ordered in ED Medications  sodium chloride 0.9 % bolus 1,000 mL (has no administration in time range)  ketorolac (TORADOL) 30 MG/ML injection 30 mg (has no administration in time range)    ED Course/ Medical Decision Making/ A&P                           Medical Decision Making Amount and/or Complexity of Data Reviewed Labs: ordered. Radiology: ordered.  Risk Prescription drug management.   ***  {Document critical care time when appropriate:1} {Document review of labs and clinical decision tools ie heart score, Chads2Vasc2 etc:1}  {Document your independent review of radiology images, and any outside records:1} {Document your discussion with family members, caretakers, and with consultants:1} {Document social determinants of health affecting pt's care:1} {Document your decision making why or why not admission, treatments were needed:1} Final Clinical Impression(s) / ED Diagnoses Final diagnoses:  None    Rx / DC Orders ED Discharge Orders     None

## 2021-12-22 NOTE — ED Triage Notes (Signed)
Pt presents to the ED with stabbing right sided chest pain and SHOB that started on Saturday. Pt states that the pain is constant but worsens when lying. Pt states that she has been running a fever. Temp of 101.1 at time of triage.

## 2021-12-23 ENCOUNTER — Emergency Department (HOSPITAL_BASED_OUTPATIENT_CLINIC_OR_DEPARTMENT_OTHER): Payer: BC Managed Care – PPO

## 2021-12-23 DIAGNOSIS — R0602 Shortness of breath: Secondary | ICD-10-CM | POA: Diagnosis not present

## 2021-12-23 DIAGNOSIS — R079 Chest pain, unspecified: Secondary | ICD-10-CM | POA: Diagnosis not present

## 2021-12-23 DIAGNOSIS — J90 Pleural effusion, not elsewhere classified: Secondary | ICD-10-CM | POA: Diagnosis not present

## 2021-12-23 LAB — RESP PANEL BY RT-PCR (FLU A&B, COVID) ARPGX2
Influenza A by PCR: NEGATIVE
Influenza B by PCR: NEGATIVE
SARS Coronavirus 2 by RT PCR: NEGATIVE

## 2021-12-23 MED ORDER — DOXYCYCLINE HYCLATE 100 MG PO TABS
100.0000 mg | ORAL_TABLET | Freq: Once | ORAL | Status: AC
  Administered 2021-12-23: 100 mg via ORAL
  Filled 2021-12-23: qty 1

## 2021-12-23 MED ORDER — DOXYCYCLINE HYCLATE 100 MG PO CAPS: 100.0000 mg | ORAL_CAPSULE | Freq: Two times a day (BID) | ORAL | 0 refills | Status: DC

## 2021-12-23 NOTE — Discharge Instructions (Signed)
Begin taking doxycycline as prescribed.  Follow-up with your primary doctor to schedule a repeat CT scan of your chest per radiology recommendations.  They want to ensure resolution of the pneumonia and that there is no underlying mass or other abnormality.  Return to the emergency department if you develop severe chest pain, worsening breathing, or for other new and concerning symptoms.

## 2021-12-23 NOTE — ED Notes (Signed)
Pt returned from CT °

## 2022-01-20 ENCOUNTER — Encounter: Payer: Self-pay | Admitting: Family Medicine

## 2022-01-20 ENCOUNTER — Ambulatory Visit: Payer: BC Managed Care – PPO | Admitting: Family Medicine

## 2022-01-20 VITALS — BP 122/70 | HR 85 | Temp 97.5°F | Ht 68.25 in | Wt 190.1 lb

## 2022-01-20 DIAGNOSIS — R0609 Other forms of dyspnea: Secondary | ICD-10-CM

## 2022-01-20 DIAGNOSIS — Z170 Estrogen receptor positive status [ER+]: Secondary | ICD-10-CM

## 2022-01-20 DIAGNOSIS — R053 Chronic cough: Secondary | ICD-10-CM

## 2022-01-20 DIAGNOSIS — R011 Cardiac murmur, unspecified: Secondary | ICD-10-CM

## 2022-01-20 DIAGNOSIS — Z8571 Personal history of Hodgkin lymphoma: Secondary | ICD-10-CM

## 2022-01-20 DIAGNOSIS — E559 Vitamin D deficiency, unspecified: Secondary | ICD-10-CM | POA: Diagnosis not present

## 2022-01-20 DIAGNOSIS — R918 Other nonspecific abnormal finding of lung field: Secondary | ICD-10-CM | POA: Diagnosis not present

## 2022-01-20 DIAGNOSIS — R739 Hyperglycemia, unspecified: Secondary | ICD-10-CM | POA: Diagnosis not present

## 2022-01-20 DIAGNOSIS — C50511 Malignant neoplasm of lower-outer quadrant of right female breast: Secondary | ICD-10-CM

## 2022-01-20 DIAGNOSIS — F40298 Other specified phobia: Secondary | ICD-10-CM

## 2022-01-20 MED ORDER — ALPRAZOLAM 0.5 MG PO TABS: 0.5000 mg | ORAL_TABLET | Freq: Every evening | ORAL | 0 refills | Status: AC | PRN

## 2022-01-20 NOTE — Patient Instructions (Signed)
Welcome to [facility]! It was a pleasure meeting you today.  Happy holidays  Let me know if need more xanax They will call you about CT chest.     PLEASE NOTE:  If you had any LAB tests please let us know if you have not heard back within a few days. You may see your results on MyChart before we have a chance to review them but we will give you a call once they are reviewed by [place]. If we ordered any REFERRALS today, please let us know if you have not heard from their office within the next week.  Let us know through MyChart if you are needing REFILLS, or have your pharmacy send [place] the request. You can also use MyChart to communicate with me or any office staff.  Please try these tips to maintain a healthy lifestyle:  Eat most of your calories during the day when you are active. Eliminate processed foods including packaged sweets (pies, cakes, cookies), reduce intake of potatoes, white bread, white pasta, and white rice. Look for whole grain options, oat flour or almond flour.  Each meal should contain half fruits/vegetables, one quarter protein, and one quarter carbs (no bigger than a computer mouse).  Cut down on sweet beverages. This includes juice, soda, and sweet tea. Also watch fruit intake, though this is a healthier sweet option, it still contains natural sugar! Limit to 3 servings daily.  Drink at least 1 glass of water with each meal and aim for at least 8 glasses per day  Exercise at least 150 minutes every week.

## 2022-01-20 NOTE — Progress Notes (Signed)
New Patient Office Visit  Subjective:  Patient ID: Denise Hull, female    DOB: [birth year]  Age: 61 y.o. MRN: [id number]  CC:  Chief Complaint  Patient presents with   Establish Care    Need new pcp, haven't had a primary in years Need a follow-up scan     HPI Denise Hull presents for new pt.  Needs PCP. Hodgkins  H/o breast ca B-tx. 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 -very sick w/flu when 61yo and hosp 10d and traumatized she thinks from that. Had shingles-no imm yet. Went to ER for CP and SOB on 12/22/21.  Temp was 101.  Did CT chest and found mass vs pneumonia.placed on abx.  Needs f/u scan.   Has had a cough for 1 yr since sick. Can cough so hard, vomits or urine incont.  Was seen originally at start.  Went to walk in clinic.  Better, but cough continues.  Went to UC in August.  Then few wks ago, CP on R and went to ER and see CT below..  Had soup just prior to ER-sugar 221 in ER 12/23/21 CT chest: IMPRESSION: No evidence of pulmonary embolus.   Rounded masslike airspace opacity anteriorly in the right upper lobe. This could reflect pneumonia. Given the masslike appearance, follow-up chest CT is recommended in 3-4 weeks following trial of antibiotic therapy to ensure resolution and exclude malignancy.   Trace right pleural effusion.   Coronary artery disease.   Aortic Atherosclerosis (ICD10-I70.0).  6.  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 Harlow Mares   LYMPH NODE BIOPSY     MASTECTOMY Bilateral 08/28/08   [place]  7/10   removed 12/10   TIBIA FRACTURE SURGERY  12/11   Repair    Family History  Problem Relation Age of Onset   Miscarriages / Stillbirths Mother    Hypertension Mother    Hyperlipidemia Mother    Thyroid disease Mother    Cancer Father        esoph   Thyroid disease Father    Depression Sister    Asthma Sister    Alcohol abuse Brother    Depression Daughter    Stroke Maternal Grandmother    Hypertension Maternal Grandmother    Hyperlipidemia Maternal Grandmother    Arthritis Maternal Grandmother    Stroke Paternal Grandmother    Arthritis Paternal Grandmother    Hypertension Paternal Grandmother     Social History   Socioeconomic History   Marital status: Married    Spouse name: Not on file   Number of children:  1   Years of education: Not on file   Highest education level: Not on file  Occupational History   Not on file  Tobacco Use   Smoking status: Never   Smokeless tobacco: Never  Vaping Use   Vaping Use: Never used  Substance and Sexual Activity   Alcohol use: Yes    Alcohol/week: 4.0 standard drinks of alcohol    Types: 4 Glasses of wine per week   Drug use: No   Sexual activity: Yes    Partners: Male    Birth control/protection: Post-menopausal  Other Topics Concern   Not on file  Social History Narrative   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  Gained 38# since covid.  Has done [employer] in past.   Tired all time. Skin: no rash, itching ENT: Spring allergies.  Eyes: no blurry vision, double vision Resp:+ cough.  DOE up stairs CV: no CP, palpitations, LE edema,  GI: no heartburn, n/v/d/c, abd pain GU: no dysuria, urgency, frequency, hematuria MSK: some joint pains Neuro: no dizziness, headache, weakness, vertigo Psych: no depression, anxiety, insomnia, SI.  More sadness over events of world  Objective:   Today's Vitals: BP 122/70   Pulse 85   Temp (!) 97.5 F (36.4 C) (Temporal)   Ht 5' 8.25" (1.734 m)   Wt 190 lb 2 oz (86.2 kg)   LMP 10/07/2008   SpO2 97%   BMI 28.70 kg/m   Physical Exam  Gen: WDWN NAD wf HEENT: NCAT, conjunctiva not injected, sclera nonicteric TM WNL B, OP moist, no exudates  NECK:  supple, no thyromegaly, no nodes, no carotid bruits CARDIAC: RRR, S1S2+, +2/6 murmur rad to B carotids. DP 2+B LUNGS: CTAB. No wheezes ABDOMEN:  BS+, soft, NTND, No HSM, no masses EXT:  no edema MSK: no gross abnormalities.  NEURO: A&O x3.  CN II-XII intact.  PSYCH: normal mood. Good eye contact   Reviewed ER records, getting hx,devising plan, ordering studies-65mn w/pt total  Assessment & Plan:   Problem List Items Addressed This Visit       Other   COUGH   History of Hodgkin's disease   [place]outer [place]   Relevant Medications   ALPRAZolam (XANAX) 0.5 MG tablet   Isolated or specific phobia   Relevant Medications   ALPRAZolam (XANAX) 0.5 MG tablet   Other Visit Diagnoses     Hyperglycemia    -  Primary   Relevant Orders   Hemoglobin A1c   Lipid panel   TSH   CBC with Differential/Platelet   Comprehensive metabolic panel   Vitamin D deficiency       Relevant Orders   VITAMIN D 25 Hydroxy (Vit-D Deficiency, Fractures)   Lung mass       Relevant Orders   CT Chest W Contrast   Dyspnea on exertion        Relevant Orders   ECHOCARDIOGRAM COMPLETE   Murmur, cardiac       Relevant Orders   ECHOCARDIOGRAM COMPLETE     Lung mass-right upper lobe-thought to possibly be pneumonia so  patient was placed on antibiotics.  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  Needs a follow-up chest CAT scan. Bilateral breast cancer diagnosed May 2010-in remission. No f/u needed(last seen 2016) History of Hodgkin's lymphoma-diagnosed 1991.  Treated.  No recurrences. Blood draw/IV phobia-chronic.  Patient takes Xanax 0.5 mg before procedures.  PDMP was checked.  Renewed prescription for #10 as she will be having blood work, CT, echo Vitamin D deficiency-chronic.  Had been on 50,000 IUs/week, but currently not on anything.  Check vitamin D Hyperglycemia-new diagnosis.  Found in the emergency room.  Patient had just eaten soup.  Will check CMP, A1c, lipids, TSH,cbc.  She may be a new diabetic.  Work on diet/exercise.  Follow-up in 2 to 3 weeks to discuss results/plan Cough-chronic for 1 year.  Postinfection and treated.  However, has not resolved.  She had an abnormal CT of the chest in October.  Need to follow-up Dyspnea on exertion-newer diagnosis.  May be multifactorial.  Inactivity, weight gain, possibly CHF (doubt as no other symptoms), lung mass, coronary artery disease (there is evidence of atherosclerosis on the CT).  Consider referral to cardiology (patient would like to hold off until the other studies are back) start doing some walking. Murmur-given the cough, DOE, history of chemo/radiation/cancers-we will check an echo  Outpatient Encounter Medications as of 01/20/2022  Medication Sig   adapalene (DIFFERIN) 0.1 % gel 1 application in the evening Externally Once a day   cetirizine (ZYRTEC) 10 MG tablet Take 10 mg by mouth daily.   Ibuprofen-Diphenhydramine Cit (ADVIL PM PO) Take 1 tablet by mouth at bedtime as needed.   Levocetirizine Dihydrochloride (XYZAL  PO) Take 10 mg by mouth daily.   [DISCONTINUED] ALPRAZolam (XANAX) 0.5 MG tablet Take 1 tablet (0.5 mg total) by mouth at bedtime as needed for anxiety.   ALPRAZolam (XANAX) 0.5 MG tablet Take 1 tablet (0.5 mg total) by mouth at bedtime as needed for anxiety.   [DISCONTINUED] doxycycline (VIBRAMYCIN) 100 MG capsule Take 1 capsule (100 mg total) by mouth 2 (two) times daily. One po bid x 7 days   [DISCONTINUED] Triamcinolone Acetonide (NASACORT ALLERGY 24HR NA) Place into the nose.   [DISCONTINUED] Vitamin D, Ergocalciferol, (DRISDOL) 1.25 MG (50000 UNIT) CAPS capsule Take 1 capsule (50,000 Units total) by mouth once a week.   No facility-administered encounter medications on file as of 01/20/2022.    Follow-up: Return in about 4 weeks (around 02/17/2022) for f/u labs, sob-4 wks me.   soon labs.   Wellington Hampshire, MD

## 2022-02-04 ENCOUNTER — Other Ambulatory Visit (INDEPENDENT_AMBULATORY_CARE_PROVIDER_SITE_OTHER): Payer: BC Managed Care – PPO

## 2022-02-04 DIAGNOSIS — R739 Hyperglycemia, unspecified: Secondary | ICD-10-CM

## 2022-02-04 DIAGNOSIS — E559 Vitamin D deficiency, unspecified: Secondary | ICD-10-CM

## 2022-02-04 LAB — CBC WITH DIFFERENTIAL/PLATELET
Basophils Absolute: 0.1 10*3/uL (ref 0.0–0.1)
Basophils Relative: 1.2 % (ref 0.0–3.0)
Eosinophils Absolute: 0.3 10*3/uL (ref 0.0–0.7)
Eosinophils Relative: 6.3 % — ABNORMAL HIGH (ref 0.0–5.0)
HCT: 41.4 % (ref 36.0–46.0)
Hemoglobin: 13.6 g/dL (ref 12.0–15.0)
Lymphocytes Relative: 36.7 % (ref 12.0–46.0)
Lymphs Abs: 1.7 10*3/uL (ref 0.7–4.0)
MCHC: 33 g/dL (ref 30.0–36.0)
MCV: 91.4 fl (ref 78.0–100.0)
Monocytes Absolute: 0.5 10*3/uL (ref 0.1–1.0)
Monocytes Relative: 11.5 % (ref 3.0–12.0)
Neutro Abs: 2.1 10*3/uL (ref 1.4–7.7)
Neutrophils Relative %: 44.3 % (ref 43.0–77.0)
Platelets: 305 10*3/uL (ref 150.0–400.0)
RBC: 4.53 Mil/uL (ref 3.87–5.11)
RDW: 13.5 % (ref 11.5–15.5)
WBC: 4.6 10*3/uL (ref 4.0–10.5)

## 2022-02-04 LAB — COMPREHENSIVE METABOLIC PANEL
ALT: 12 U/L (ref 0–35)
AST: 16 U/L (ref 0–37)
Albumin: 4.4 g/dL (ref 3.5–5.2)
Alkaline Phosphatase: 69 U/L (ref 39–117)
BUN: 18 mg/dL (ref 6–23)
CO2: 32 mEq/L (ref 19–32)
Calcium: 10 mg/dL (ref 8.4–10.5)
Chloride: 104 mEq/L (ref 96–112)
Creatinine, Ser: 0.76 mg/dL (ref 0.40–1.20)
GFR: 83.81 mL/min (ref >60.00)
Glucose, Bld: 97 mg/dL (ref 70–99)
Potassium: 6 mEq/L — ABNORMAL HIGH (ref 3.5–5.1)
Sodium: 141 mEq/L (ref 135–145)
Total Bilirubin: 0.6 mg/dL (ref 0.2–1.2)
Total Protein: 6.6 g/dL (ref 6.0–8.3)

## 2022-02-04 LAB — VITAMIN D 25 HYDROXY (VIT D DEFICIENCY, FRACTURES): VITD: 26.33 ng/mL — ABNORMAL LOW (ref 30.00–100.00)

## 2022-02-04 LAB — LIPID PANEL
Cholesterol: 235 mg/dL — ABNORMAL HIGH (ref 0–200)
HDL: 75 mg/dL (ref >39.00)
LDL Cholesterol: 143 mg/dL — ABNORMAL HIGH (ref 0–99)
NonHDL: 159.98
Total CHOL/HDL Ratio: 3
Triglycerides: 86 mg/dL (ref 0.0–149.0)
VLDL: 17.2 mg/dL (ref 0.0–40.0)

## 2022-02-04 LAB — HEMOGLOBIN A1C: Hgb A1c MFr Bld: 6.2 % (ref 4.6–6.5)

## 2022-02-04 LAB — TSH: TSH: 6.74 u[IU]/mL — ABNORMAL HIGH (ref 0.35–5.50)

## 2022-02-04 NOTE — Progress Notes (Signed)
1.  Potassium high-prob from lab draw, but needs to repeat tomorrow or today(but needs to take xanax)  2.  A1C(3 month average of sugars) is elevated.  This is considered PreDiabetes.  Work on diet-decrease sugars and starches and aim for 30 minutes of exercise 5 days/week to prevent progression to diabetes  3.  Vitamin D a little low-take 2000 iu/day 4.  Thyroid is a little off-see if can add free T3 and free T4 to this lab or to redray 5.  Your cholesterol levels are elevated.  Work on low cholesterol and lower carbs/sugars diet and  get exercise to try to lower your cholesterol.

## 2022-02-05 ENCOUNTER — Other Ambulatory Visit: Payer: Self-pay | Admitting: *Deleted

## 2022-02-05 DIAGNOSIS — E875 Hyperkalemia: Secondary | ICD-10-CM

## 2022-02-05 DIAGNOSIS — R7989 Other specified abnormal findings of blood chemistry: Secondary | ICD-10-CM

## 2022-02-10 DIAGNOSIS — D2272 Melanocytic nevi of left lower limb, including hip: Secondary | ICD-10-CM | POA: Diagnosis not present

## 2022-02-10 DIAGNOSIS — D485 Neoplasm of uncertain behavior of skin: Secondary | ICD-10-CM | POA: Diagnosis not present

## 2022-02-10 DIAGNOSIS — L578 Other skin changes due to chronic exposure to nonionizing radiation: Secondary | ICD-10-CM | POA: Diagnosis not present

## 2022-02-10 DIAGNOSIS — L821 Other seborrheic keratosis: Secondary | ICD-10-CM | POA: Diagnosis not present

## 2022-02-10 DIAGNOSIS — D225 Melanocytic nevi of trunk: Secondary | ICD-10-CM | POA: Diagnosis not present

## 2022-02-18 ENCOUNTER — Ambulatory Visit: Payer: BC Managed Care – PPO | Admitting: Family Medicine

## 2022-02-20 ENCOUNTER — Ambulatory Visit
Admission: RE | Admit: 2022-02-20 | Discharge: 2022-02-20 | Disposition: A | Discharge status: Home / Self Care | Payer: BC Managed Care – PPO | Source: Ambulatory Visit | Attending: Family Medicine | Admitting: Family Medicine

## 2022-02-20 DIAGNOSIS — Z8571 Personal history of Hodgkin lymphoma: Secondary | ICD-10-CM | POA: Diagnosis not present

## 2022-02-20 DIAGNOSIS — Z853 Personal history of malignant neoplasm of breast: Secondary | ICD-10-CM | POA: Diagnosis not present

## 2022-02-20 DIAGNOSIS — R918 Other nonspecific abnormal finding of lung field: Secondary | ICD-10-CM

## 2022-02-20 DIAGNOSIS — I700 Atherosclerosis of aorta: Secondary | ICD-10-CM | POA: Diagnosis not present

## 2022-02-20 DIAGNOSIS — J984 Other disorders of lung: Secondary | ICD-10-CM | POA: Diagnosis not present

## 2022-02-20 MED ORDER — IOPAMIDOL (ISOVUE-300) INJECTION 61%
75.0000 mL | Freq: Once | INTRAVENOUS | Status: AC | PRN
  Administered 2022-02-20: 75 mL via INTRAVENOUS

## 2022-02-24 ENCOUNTER — Other Ambulatory Visit: Payer: Self-pay | Admitting: *Deleted

## 2022-02-24 DIAGNOSIS — E041 Nontoxic single thyroid nodule: Secondary | ICD-10-CM

## 2022-02-27 ENCOUNTER — Ambulatory Visit: Payer: BC Managed Care – PPO | Admitting: Family Medicine

## 2022-03-04 ENCOUNTER — Ambulatory Visit (INDEPENDENT_AMBULATORY_CARE_PROVIDER_SITE_OTHER): Payer: BC Managed Care – PPO

## 2022-03-04 DIAGNOSIS — R011 Cardiac murmur, unspecified: Secondary | ICD-10-CM | POA: Diagnosis not present

## 2022-03-04 DIAGNOSIS — R0609 Other forms of dyspnea: Secondary | ICD-10-CM

## 2022-03-04 LAB — ECHOCARDIOGRAM COMPLETE
AR max vel: 1.48 cm2
AV Area VTI: 1.5 cm2
AV Area mean vel: 1.46 cm2
AV Mean grad: 10 mmHg
AV Peak grad: 18.5 mmHg
Ao pk vel: 2.15 m/s
Area-P 1/2: 4.49 cm2
Calc EF: 52 %
S' Lateral: 2.08 cm
Single Plane A2C EF: 47.3 %
Single Plane A4C EF: 56.1 %

## 2022-03-12 ENCOUNTER — Ambulatory Visit
Admission: RE | Admit: 2022-03-12 | Discharge: 2022-03-12 | Disposition: A | Discharge status: Home / Self Care | Payer: BC Managed Care – PPO | Source: Ambulatory Visit | Attending: Family Medicine | Admitting: Family Medicine

## 2022-03-12 DIAGNOSIS — E041 Nontoxic single thyroid nodule: Secondary | ICD-10-CM | POA: Diagnosis not present

## 2022-03-16 ENCOUNTER — Encounter: Payer: Self-pay | Admitting: Family Medicine

## 2022-03-16 ENCOUNTER — Ambulatory Visit: Payer: BC Managed Care – PPO | Admitting: Family Medicine

## 2022-03-16 VITALS — BP 116/82 | HR 89 | Temp 97.7°F | Resp 16 | Ht 68.0 in | Wt 190.4 lb

## 2022-03-16 DIAGNOSIS — R0609 Other forms of dyspnea: Secondary | ICD-10-CM | POA: Diagnosis not present

## 2022-03-16 DIAGNOSIS — E041 Nontoxic single thyroid nodule: Secondary | ICD-10-CM

## 2022-03-16 DIAGNOSIS — Z86010 Personal history of colonic polyps: Secondary | ICD-10-CM

## 2022-03-16 DIAGNOSIS — J0101 Acute recurrent maxillary sinusitis: Secondary | ICD-10-CM | POA: Diagnosis not present

## 2022-03-16 MED ORDER — AMOXICILLIN-POT CLAVULANATE 875-125 MG PO TABS: 1.0000 | ORAL_TABLET | Freq: Two times a day (BID) | ORAL | 0 refills | Status: DC

## 2022-03-16 NOTE — Progress Notes (Signed)
Subjective:     Patient ID: Denise Hull, female    DOB: 11-19-1960, 62 y.o.   MRN: [id number]  Chief Complaint  Patient presents with   Shortness of Breath   Sinusitis    Ongoing for 1 week Pressure under right eye with green mucus     HPI  SOB-had chest CT-clearing. 2.  Sinusitis-pressure under R eye for 1 wk-worse past 1 day,  green mucus.  Husband was sick wt after Christmas.  Gets infections freq.  No fever.   Gained 20# during covid.   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 Face gets really red.  Gets sob  Health Maintenance Due  Topic Date Due   Zoster Vaccines- Shingrix (1 of 2) Never done   COLONOSCOPY (Pts 45-72yr Insurance coverage will need to be confirmed)  07/23/2020   COVID-19 Vaccine (5 - 2023-24 season) 03/11/2022    Past Medical History:  Diagnosis Date   Abnormal Pap smear of cervix    Allergy    Anemia    Arthritis    Basal cell carcinoma    GERD (gastroesophageal reflux disease)    Heart murmur    History of Hodgkin's disease 1991   [place]   ER+/PR+   Osteopenia    Shingles     Past Surgical History:  Procedure Laterality Date   BASAL CELL CARCINOMA EXCISION     Removal   BREAST BIOPSY  5/10   Invasive ductal changes   BREAST LUMPECTOMY Bilateral 5/10   - nodes   LAPAROSCOPIC BILATERAL SALPINGO OOPHERECTOMY  03/07/09   with implant replacement with Dr. BHarlow Mares  LYMPH NODE BIOPSY     MASTECTOMY Bilateral 08/28/08   PORTACATH PLACEMENT  7/10   removed 12/10   TIBIA FRACTURE SURGERY  12/11   Repair    Outpatient Medications Prior to Visit  Medication Sig Dispense Refill   adapalene (DIFFERIN) 0.1 % gel 1 application in the evening Externally Once a day     ALPRAZolam (XANAX) 0.5 MG tablet Take 1 tablet (0.5 mg total) by mouth at bedtime as needed for anxiety. 10 tablet 0   cetirizine (ZYRTEC) 10 MG tablet Take 10 mg by mouth daily.     Ibuprofen-Diphenhydramine Cit (ADVIL PM PO) Take 1  tablet by mouth at bedtime as needed.     Levocetirizine Dihydrochloride (XYZAL PO) Take 10 mg by mouth daily.     No facility-administered medications prior to visit.    Allergies  Allergen Reactions   Aspirin     REACTION: Nosebleeds   Sulfonamide Derivatives Other (See Comments)    Vaginal Burning    ROS neg/noncontributory except as noted HPI/below      Objective:     BP 116/82   Pulse 89   Temp 97.7 F (36.5 C) (Temporal)   Resp 16   Ht '5\' 8"'$  (1.727 m)   Wt 190 lb 6.4 oz (86.4 kg)   LMP 10/07/2008   SpO2 97%   BMI 28.95 kg/m  Wt Readings from Last 3 Encounters:  03/16/22 190 lb 6.4 oz (86.4 kg)  01/20/22 190 lb 2 oz (86.2 kg)  12/22/21 193 lb (87.5 kg)    Physical Exam   Gen: WDWN NAD HEENT: NCAT, conjunctiva not injected, sclera nonicteric TM WNL B, OP moist, no exudates tender r max sinus NECK:  supple, no thyromegaly, no nodes, no carotid bruits CARDIAC: RRR, S1S2+, 2/6 murmur RSB. DP 2+B LUNGS: CTAB. No wheezes ABDOMEN:  BS+, soft, NTND, No HSM, no masses EXT:  no edema MSK: no gross abnormalities.  NEURO: A&O x3.  CN II-XII intact.  PSYCH: normal mood. Good eye contact  Reviewed scans/echo.     Assessment & Plan:   Problem List Items Addressed This Visit   None Visit Diagnoses     Dyspnea on exertion    -  Primary   Relevant Orders   Ambulatory referral to Cardiology   Thyroid nodule       Acute recurrent maxillary sinusitis          DOE-murmur   ?deconditioning, heart, valve, etc.  Refer Card R max sinusitis-augmentin 875 bid. Thyroid nodule-per rad, annual U/S x5.   Colon polyps-pt will call Dr. Collene Mares  No orders of the defined types were placed in this encounter.   Wellington Hampshire, MD

## 2022-03-16 NOTE — Patient Instructions (Signed)
It was very nice to see you today!  Sending augmentin 875 for 10 days.  Sending referral to Cardiology.    PLEASE NOTE:  If you had any lab tests please let us know if you have not heard back within a few days. You may see your results on MyChart before we have a chance to review them but we will give you a call once they are reviewed by [place]. If we ordered any referrals today, please let us know if you have not heard from their office within the next week.   Please try these tips to maintain a healthy lifestyle:  Eat most of your calories during the day when you are active. Eliminate processed foods including packaged sweets (pies, cakes, cookies), reduce intake of potatoes, white bread, white pasta, and white rice. Look for whole grain options, oat flour or almond flour.  Each meal should contain half fruits/vegetables, one quarter protein, and one quarter carbs (no bigger than a computer mouse).  Cut down on sweet beverages. This includes juice, soda, and sweet tea. Also watch fruit intake, though this is a healthier sweet option, it still contains natural sugar! Limit to 3 servings daily.  Drink at least 1 glass of water with each meal and aim for at least 8 glasses per day  Exercise at least 150 minutes every week.

## 2022-04-09 ENCOUNTER — Encounter (HOSPITAL_BASED_OUTPATIENT_CLINIC_OR_DEPARTMENT_OTHER): Payer: Self-pay | Admitting: Cardiology

## 2022-04-09 ENCOUNTER — Ambulatory Visit (HOSPITAL_BASED_OUTPATIENT_CLINIC_OR_DEPARTMENT_OTHER): Payer: BC Managed Care – PPO | Admitting: Cardiology

## 2022-04-09 VITALS — BP 122/76 | HR 76 | Ht 68.0 in | Wt 190.0 lb

## 2022-04-09 DIAGNOSIS — Z8571 Personal history of Hodgkin lymphoma: Secondary | ICD-10-CM

## 2022-04-09 DIAGNOSIS — R079 Chest pain, unspecified: Secondary | ICD-10-CM

## 2022-04-09 DIAGNOSIS — I358 Other nonrheumatic aortic valve disorders: Secondary | ICD-10-CM

## 2022-04-09 DIAGNOSIS — I471 Supraventricular tachycardia, unspecified: Secondary | ICD-10-CM

## 2022-04-09 DIAGNOSIS — R0609 Other forms of dyspnea: Secondary | ICD-10-CM | POA: Diagnosis not present

## 2022-04-09 DIAGNOSIS — Z923 Personal history of irradiation: Secondary | ICD-10-CM

## 2022-04-09 DIAGNOSIS — R002 Palpitations: Secondary | ICD-10-CM

## 2022-04-09 DIAGNOSIS — Z7189 Other specified counseling: Secondary | ICD-10-CM | POA: Diagnosis not present

## 2022-04-09 DIAGNOSIS — E7800 Pure hypercholesterolemia, unspecified: Secondary | ICD-10-CM | POA: Insufficient documentation

## 2022-04-09 NOTE — Patient Instructions (Signed)
Medication Instructions:  The current medical regimen is effective;  continue present plan and medications.   *If you need a refill on your cardiac medications before your next appointment, please call your pharmacy*   Lab Work: None   Testing/Procedures: Your provider has recommended you have a Cardiac stress test.   Please arrive 15 minutes prior to your appointment time for registration and insurance purposes.  The test will take approximately 45 minutes to complete.  How to prepare for your Exercise Stress Test: Do bring a list of your current medications with you.  If not listed below, you may take your medications as normal. Do wear comfortable clothes (no dresses or overalls) and walking shoes, tennis shoes preferred (no heels or open toed shoes are allowed) Do Not wear cologne, perfume, aftershave or lotions (deodorant is allowed).   If these instructions are not followed, your test will have to be rescheduled.  If you have questions or concerns about your appointment, you can call the Stress Lab at [phone].  If you cannot keep your appointment, please provide 24 hours notification to the Stress Lab, to avoid a possible $50 charge to your account    Follow-Up: At [facility], you and your health needs are our priority.  As part of our continuing mission to provide you with exceptional heart care, we have created designated Provider Care Teams.  These Care Teams include your primary Cardiologist (physician) and Advanced Practice Providers (APPs -  Physician Assistants and Nurse Practitioners) who all work together to provide you with the care you need, when you need it.  We recommend signing up for the patient portal called "MyChart".  Sign up information is provided on this After Visit Summary.  MyChart is used to connect with patients for Virtual Visits (Telemedicine).  Patients are able to view lab/test results, encounter notes, upcoming appointments, etc.   Non-urgent messages can be sent to your provider as well.   To learn more about what you can do with MyChart, go to [email or web address].    Your next appointment:   1 year(s)  Provider:   Buford Dresser, MD    Other Instructions If stress test is normal follow up will be in 1 yr.  We will call you with results.

## 2022-04-09 NOTE — Progress Notes (Signed)
Cardiology Office Note:    Date:  04/09/2022   ID:  Denise Hull, DOB [birth year], MRN [id number]  PCP:  Tawnya Crook, MD  Cardiologist:  Buford Dresser, MD  Referring MD: Tawnya Crook, MD   CC:  New patient evaluation for DOE   History of Present Illness:    Denise Hull is a 62 y.o. 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  On 03/16/2022 she saw Dr. Cherlynn Kaiser and reported DOE. On examination she was also noted to have a 2/6 murmur at the RSB. She was referred to cardiology for further evaluation.  Cardiovascular risk factors: Prior clinical ASCVD: None. Comorbid conditions: Hyperlipidemia - Never on cholesterol medications. Metabolic syndrome/Obesity:  Prior to the [place] pandemic she was 150-155 lbs, currently 190 lbs in clinic. Family history: Her grandmother had multiple TIAs, hypertension, and dementia, lived to 38 yo. Her mother had hypertension. Her paternal grandmother had multiple strokes and died of a stroke. Hyperlipidemia also runs in he family. No known family history of sudden cardiac death.  Prior cardiac testing and/or incidental findings on other testing (ie coronary calcium): Previously saw a cardiologist when she was 62 yo and wore a monitor. Onset of her symptoms was spontaneous, and they resolved spontaneously as well. This had been in the setting of shoveling snow in [place]. Exercise level: Enjoys participating in a [place] dance group. They perform in public outdoors. Others have noted that she appears very red in the face and diaphoretic. She states that she has struggled to get though a 3-4 minute dance with shortness of breath. She feels as though she doesn't receive enough air. She has  needed to sit down after her head began spinning. Current diet: She grows a lot of her own vegetables at home. However, she has struggled with some weight gain since the COVID pandemic. She admits to eating too much overall and drinking too much wine. She plans to cut back and work on dietary changes.  When she had presented to the ED 12/2021 she was suffering from right, stabbing chest pain and thought she was having a heart attack. At the time she was also struggling to breathe with walking upstairs empty handed. She notes that this was an extreme episode of chest pain. She has had further episodes but not nearly as severe. Of note, she may have acid reflux very rarely, a couple times a year.  Additionally she complains of occasional palpitations that she describes as "her heart flipping" usually lasting for a couple minutes.  She denies any peripheral edema. No headaches, syncope, orthopnea, or PND.  Past Medical History:  Diagnosis Date   Abnormal Pap smear of cervix    Allergy    Anemia    Arthritis    Basal cell carcinoma    GERD (gastroesophageal reflux disease)    Heart murmur    History of Hodgkin's disease 1991   Invasive ductal carcinoma of breast ([facility]) 10/2008   ER+/PR+   Osteopenia    Shingles     Past Surgical History:  Procedure Laterality Date   BASAL CELL CARCINOMA EXCISION     Removal   BREAST BIOPSY  5/10   Invasive ductal changes   BREAST LUMPECTOMY Bilateral 5/10   -  nodes   LAPAROSCOPIC BILATERAL SALPINGO OOPHERECTOMY  03/07/09   with implant replacement with Dr. Harlow Mares   LYMPH NODE BIOPSY     MASTECTOMY Bilateral 08/28/08   PORTACATH PLACEMENT  7/10   removed 12/10   TIBIA FRACTURE SURGERY  12/11   Repair    Current Medications: Current Outpatient Medications on File Prior to Visit  Medication Sig   adapalene (DIFFERIN) 0.1 % gel 1 application in the evening Externally Once a day   ALPRAZolam (XANAX) 0.5 MG tablet Take 1 tablet (0.5 mg total) by  mouth at bedtime as needed for anxiety.   Ibuprofen-Diphenhydramine Cit (ADVIL PM PO) Take 1 tablet by mouth at bedtime as needed.   No current facility-administered medications on file prior to visit.     Allergies:   Aspirin and Sulfonamide derivatives   Social History   Tobacco Use   Smoking status: Never   Smokeless tobacco: Never  Vaping Use   Vaping Use: Never used  Substance Use Topics   Alcohol use: Yes    Alcohol/week: 4.0 standard drinks of alcohol    Types: 4 Glasses of wine per week   Drug use: No    Family History: family history includes Alcohol abuse in her brother; Arthritis in her maternal grandmother and paternal grandmother; Asthma in her sister; Cancer in her father; Depression in her daughter and sister; Hyperlipidemia in her maternal grandmother and mother; Hypertension in her maternal grandmother, mother, and paternal grandmother; Miscarriages / [place] in her mother; Stroke in her maternal grandmother and paternal grandmother; Thyroid disease in her father and mother.  ROS:   Please see the history of present illness.  Additional pertinent ROS: Constitutional: Negative for chills, fever, night sweats, unintentional weight loss  HENT: Negative for ear pain and hearing loss.   Eyes: Negative for loss of vision and eye pain.  Respiratory: Negative for cough, sputum, wheezing. Positive for SOB.   Cardiovascular: See HPI. Gastrointestinal: Negative for abdominal pain, melena, and hematochezia.  Genitourinary: Negative for dysuria and hematuria.  Musculoskeletal: Negative for falls and myalgias.  Skin: Negative for itching and rash.  Neurological: Negative for focal weakness, focal sensory changes and loss of consciousness.  Endo/Heme/Allergies: Does not bruise/bleed easily.     EKGs/Labs/Other Studies Reviewed:    The following studies were reviewed today:  Echo  03/04/2022: Sonographer Comments: Image acquisition challenging due to mastectomy and   Image acquisition challenging due to breast implants. Patient has phobia  of IV insertion; would require Xanax for any IV insertion.   IMPRESSIONS   1. Left ventricular ejection fraction, by estimation, is 65 to 70%. Left  ventricular ejection fraction by PLAX is 70 %. The left ventricle has  normal function. The left ventricle has no regional wall motion  abnormalities. Left ventricular diastolic  parameters are consistent with Grade I diastolic dysfunction (impaired  relaxation).   2. Right ventricular systolic function is normal. The right ventricular  size is normal. There is normal pulmonary artery systolic pressure.   3. The mitral valve is abnormal. Trivial mitral valve regurgitation.   4. The aortic valve has an indeterminant number of cusps. There is  moderate calcification of the aortic valve. Aortic valve regurgitation is  not visualized. Mild aortic valve stenosis. Aortic valve area, by VTI  measures 1.50 cm. Aortic valve mean  gradient measures 10.0 mmHg. Aortic valve Vmax measures 2.15 m/s. Peak  gradient 18.5 mmHg, DI 0.48, LVOT diameter 2.0 cm   5. The inferior vena cava is  normal in size with greater than 50%  respiratory variability, suggesting right atrial pressure of 3 mmHg.   Comparison(s): No prior Echocardiogram.   CT Chest  02/20/2022: IMPRESSION: 1. Previously identified nodular density in the right upper lobe has nearly completely resolved. There is minimal residual linear scarring or atelectasis in this region. 2. No new focal lung infiltrate. 3. 19 mm left thyroid nodule. Recommend nonemergent thyroid ultrasound.   Aortic Atherosclerosis (ICD10-I70.0).  EKG:  EKG is personally reviewed.   04/09/2022:  NSR at 76 bpm  Recent Labs: 02/04/2022: ALT 12; BUN 18; Creatinine, Ser 0.76; Hemoglobin 13.6; Platelets 305.0; Potassium 6.0 No hemolysis seen; Sodium 141; TSH 6.74   Recent Lipid Panel    Component Value Date/Time   CHOL 235 (H) 02/04/2022 0843    CHOL 231 (H) 03/25/2018 0842   TRIG 86.0 02/04/2022 0843   HDL 75.00 02/04/2022 0843   HDL 83 03/25/2018 0842   CHOLHDL 3 02/04/2022 0843   VLDL 17.2 02/04/2022 0843   LDLCALC 143 (H) 02/04/2022 0843   LDLCALC 134 (H) 03/25/2018 0842    Physical Exam:    VS:  BP 122/76   Pulse 76   Ht '5\' 8"'$  (1.727 m)   Wt 190 lb (86.2 kg)   LMP 10/07/2008   BMI 28.89 kg/m     Wt Readings from Last 3 Encounters:  04/09/22 190 lb (86.2 kg)  03/16/22 190 lb 6.4 oz (86.4 kg)  01/20/22 190 lb 2 oz (86.2 kg)    GEN: Well nourished, well developed in no acute distress HEENT: Normal, moist mucous membranes NECK: No JVD CARDIAC: regular rhythm, normal S1 and S2, no rubs or gallops. 2/6 murmur. VASCULAR: Radial and DP pulses 2+ bilaterally. No carotid bruits RESPIRATORY:  Clear to auscultation without rales, wheezing or rhonchi  ABDOMEN: Soft, non-tender, non-distended MUSCULOSKELETAL:  Ambulates independently SKIN: Warm and dry, no edema NEUROLOGIC:  Alert and oriented x 3. No focal neuro deficits noted. PSYCHIATRIC:  Normal affect    ASSESSMENT:    1. Dyspnea on exertion   2. Chest pain of uncertain etiology   3. Pure hypercholesterolemia   4. Cardiac risk counseling   5. Counseling on health promotion and disease prevention   6. Heart palpitations   7. Paroxysmal SVT (supraventricular tachycardia)   8. Aortic valve sclerosis   9. History of radiation therapy   10. History of Hodgkin's disease    PLAN:    Dyspnea on exertion Chest pain -discussed treadmill stress, nuclear stress/lexiscan, and CT coronary angiography. Discussed pros and cons of each, including but not limited to false positive/false negative risk, radiation risk, and risk of IV contrast dye. Based on shared decision making, decision was made to pursue exercise treadmill stress test as first line test  Shared Decision Making/Informed Consent The risks [chest pain, shortness of breath, cardiac arrhythmias, dizziness,  blood pressure fluctuations, myocardial infarction, stroke/transient ischemic attack, and life-threatening complications (estimated to be 1 in 10,000)], benefits (risk stratification, diagnosing coronary artery disease, treatment guidance) and alternatives of an exercise tolerance test were discussed in detail with Denise Hull and she agrees to proceed.   Updated to add: reassuring stress test, 6.8 mets, 103% APMHR. Negative for ischemia  Palpitations Paroxysmal SVT -will contact me if symptoms worsen  Aortic valve sclerosis History of Hodgkin's disease History of radiation therapy -echo with aortic sclerosis as source of murmur  Hypercholesterolemia: has never been on medication  Cardiac risk counseling and prevention recommendations: -recommend heart healthy/Mediterranean diet, with whole  grains, fruits, vegetable, fish, lean meats, nuts, and olive oil. Limit salt. -recommend moderate walking, 3-5 times/week for 30-50 minutes each session. Aim for at least 150 minutes.week. Goal should be pace of 3 miles/hours, or walking 1.5 miles in 30 minutes -recommend avoidance of tobacco products. Avoid excess alcohol. -ASCVD risk score: The 10-year ASCVD risk score (Arnett DK, et al., 2019) is: 3.5%   Values used to calculate the score:     Age: 50 years     Sex: Female     Is Non-Hispanic African American: No     Diabetic: No     Tobacco smoker: No     Systolic Blood Pressure: 123XX123 mmHg     Is BP treated: No     HDL Cholesterol: 75 mg/dL     Total Cholesterol: 235 mg/dL    Plan for follow up: 1 year or sooner as needed.  Buford Dresser, MD, PhD, [facility]    Medication Adjustments/Labs and Tests Ordered: Current medicines are reviewed at length with the patient today.  Concerns regarding medicines are outlined above.   Orders Placed This Encounter  Procedures   Cardiac Stress Test: Informed Consent Details: Physician/Practitioner Attestation;  Transcribe to consent form and obtain patient signature   Exercise Tolerance Test   EKG 12-Lead   No orders of the defined types were placed in this encounter.  Patient Instructions  Medication Instructions:  The current medical regimen is effective;  continue present plan and medications.   *If you need a refill on your cardiac medications before your next appointment, please call your pharmacy*   Lab Work: None   Testing/Procedures: Your provider has recommended you have a Cardiac stress test.   Please arrive 15 minutes prior to your appointment time for registration and insurance purposes.  The test will take approximately 45 minutes to complete.  How to prepare for your Exercise Stress Test: Do bring a list of your current medications with you.  If not listed below, you may take your medications as normal. Do wear comfortable clothes (no dresses or overalls) and walking shoes, tennis shoes preferred (no heels or open toed shoes are allowed) Do Not wear cologne, perfume, aftershave or lotions (deodorant is allowed).   If these instructions are not followed, your test will have to be rescheduled.  If you have questions or concerns about your appointment, you can call the Stress Lab at [phone].  If you cannot keep your appointment, please provide 24 hours notification to the Stress Lab, to avoid a possible $50 charge to your account    Follow-Up: At [facility], you and your health needs are our priority.  As part of our continuing mission to provide you with exceptional heart care, we have created designated Provider Care Teams.  These Care Teams include your primary Cardiologist (physician) and Advanced Practice Providers (APPs -  Physician Assistants and Nurse Practitioners) who all work together to provide you with the care you need, when you need it.  We recommend signing up for the patient portal called "MyChart".  Sign up information is provided on this  After Visit Summary.  MyChart is used to connect with patients for Virtual Visits (Telemedicine).  Patients are able to view lab/test results, encounter notes, upcoming appointments, etc.  Non-urgent messages can be sent to your provider as well.   To learn more about what you can do with MyChart, go to [email or web address].    Your next appointment:   1  year(s)  Provider:   Buford Dresser, MD    Other Instructions If stress test is normal follow up will be in 1 yr.  We will call you with results.   I,Mathew Stumpf,acting as a Education administrator for [employer], MD.,have documented all relevant documentation on the behalf of Buford Dresser, MD,as directed by  Buford Dresser, MD while in the presence of Buford Dresser, MD.  I, Buford Dresser, MD, have reviewed all documentation for this visit. The documentation on 05/11/22 for the exam, diagnosis, procedures, and orders are all accurate and complete.   Signed, Buford Dresser, MD PhD 04/09/2022     [place]

## 2022-04-16 ENCOUNTER — Telehealth (HOSPITAL_COMMUNITY): Payer: Self-pay | Admitting: *Deleted

## 2022-04-16 NOTE — Telephone Encounter (Signed)
Patient reminded of ETT on 04/20/22 at 3:30. Instructions given.

## 2022-04-20 ENCOUNTER — Ambulatory Visit (HOSPITAL_COMMUNITY): Payer: BC Managed Care – PPO | Attending: Cardiology

## 2022-04-20 DIAGNOSIS — R0609 Other forms of dyspnea: Secondary | ICD-10-CM | POA: Insufficient documentation

## 2022-04-20 DIAGNOSIS — R079 Chest pain, unspecified: Secondary | ICD-10-CM

## 2022-04-22 LAB — EXERCISE TOLERANCE TEST
Angina Index: 0
Estimated workload: 6.8
Exercise duration (min): 4 min
Exercise duration (sec): 51 s
MPHR: 158 {beats}/min
Peak HR: 164 {beats}/min
Percent HR: 103 %
Rest HR: 83 {beats}/min

## 2022-09-03 DIAGNOSIS — L821 Other seborrheic keratosis: Secondary | ICD-10-CM | POA: Diagnosis not present

## 2022-09-03 DIAGNOSIS — L578 Other skin changes due to chronic exposure to nonionizing radiation: Secondary | ICD-10-CM | POA: Diagnosis not present

## 2022-09-03 DIAGNOSIS — L719 Rosacea, unspecified: Secondary | ICD-10-CM | POA: Diagnosis not present

## 2022-09-03 DIAGNOSIS — D225 Melanocytic nevi of trunk: Secondary | ICD-10-CM | POA: Diagnosis not present

## 2022-09-03 DIAGNOSIS — D485 Neoplasm of uncertain behavior of skin: Secondary | ICD-10-CM | POA: Diagnosis not present

## 2022-09-03 DIAGNOSIS — C44511 Basal cell carcinoma of skin of breast: Secondary | ICD-10-CM | POA: Diagnosis not present

## 2022-09-14 ENCOUNTER — Telehealth: Payer: Self-pay | Admitting: Family Medicine

## 2022-09-14 ENCOUNTER — Ambulatory Visit (INDEPENDENT_AMBULATORY_CARE_PROVIDER_SITE_OTHER): Payer: BC Managed Care – PPO | Admitting: Family Medicine

## 2022-09-14 ENCOUNTER — Encounter: Payer: Self-pay | Admitting: Family Medicine

## 2022-09-14 VITALS — BP 128/68 | HR 93 | Temp 97.6°F | Resp 18 | Ht 68.0 in | Wt 193.4 lb

## 2022-09-14 DIAGNOSIS — Z0000 Encounter for general adult medical examination without abnormal findings: Secondary | ICD-10-CM

## 2022-09-14 DIAGNOSIS — C50511 Malignant neoplasm of lower-outer quadrant of right female breast: Secondary | ICD-10-CM

## 2022-09-14 DIAGNOSIS — Z9882 Breast implant status: Secondary | ICD-10-CM | POA: Diagnosis not present

## 2022-09-14 DIAGNOSIS — N644 Mastodynia: Secondary | ICD-10-CM

## 2022-09-14 NOTE — Progress Notes (Signed)
Phone [phone]   Subjective:   Patient is a 62 y.o. female presenting for annual physical.    Chief Complaint  Patient presents with   Annual Exam    CPE No food, had coffee with half and half Left breast pain, possibly popped silicon on waterslide    Left breast pain - She believes she may have popped her left breast implant a week ago when on a water slide with her grandchildren. She reports she felt immediate pain when it occurred. She has associated muscle tightness, especially when raising her arm. Denies any chest pain or shortness of breath. Had her bilateral breast implants placed in 2011, implants are silicone. Has hx of breast cancer. Elevated blood pressure - Her blood pressure was elevated during initial check at 160/88. When rechecked, it improved to 128/68. She has a cuff at home and is receptive to monitoring her blood pressure. Checked 2 months ago, reading was "low" Congestion - Endorses post-nasal drip and congestion. She reports having to constantly clear her throat. Has stopped using Flonase due to it drying her nose-was helping some.  Denies reflux..  Immunizations -Will schedule to received the shingles vaccine, plans to receive it when getting blood work. Needle phobia-takes Xanax for blood draws.has plenty for now.  See problem oriented charting- ROS- ROS: Gen: no fever, chills  Skin: no rash, itching ENT: HPI Eyes: no blurry vision, double vision Resp: no cough, wheeze,SOB CV: no CP, palpitations, LE edema,  GI: no heartburn, n/v/d/c, abd pain GU: no dysuria, urgency, frequency, hematuria MSK: no joint pain, myalgias, back pain Neuro: no dizziness, headache, weakness, vertigo Psych: no depression, anxiety, insomnia, SI   The following were reviewed and entered/updated in epic: Past Medical History:  Diagnosis Date   Abnormal Pap smear of cervix    Allergy    Anemia    Arthritis    Basal cell carcinoma    GERD (gastroesophageal reflux disease)     Heart murmur    History of Hodgkin's disease 1991   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+   Osteopenia    Shingles    Patient Active Problem List   Diagnosis Date Noted   Aortic valve sclerosis 04/09/2022   Paroxysmal SVT (supraventricular tachycardia) 04/09/2022   Pure hypercholesterolemia 04/09/2022   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   Breast cancer screening, high risk patient 07/09/2014   Breast cancer of lower-outer quadrant of right female breast (HCC) 11/29/2012   GERD 12/29/2007   ALLERGIC RHINITIS 11/24/2007   FIBROIDS, UTERUS 11/23/2007   DEPRESSION, SITUATIONAL 11/23/2007   COUGH 11/23/2007   History of Hodgkin's disease 11/23/2007   BASAL CELL CARCINOMA, HX OF 11/23/2007   Past Surgical History:  Procedure Laterality Date   BASAL CELL CARCINOMA EXCISION     Removal   BREAST BIOPSY  5/10   Invasive ductal changes   BREAST LUMPECTOMY Bilateral 5/10   - nodes   LAPAROSCOPIC BILATERAL SALPINGO OOPHERECTOMY  03/07/09   with implant replacement with Dr. Odis Luster   LYMPH NODE BIOPSY     MASTECTOMY Bilateral 08/28/08   PORTACATH PLACEMENT  7/10   removed 12/10   TIBIA FRACTURE SURGERY  12/11   Repair    Family History  Problem Relation Age of Onset   Miscarriages / Stillbirths Mother    Hypertension Mother    Hyperlipidemia Mother    Thyroid disease Mother    Cancer Father        esoph   Thyroid  disease Father    Depression Sister    Asthma Sister    Alcohol abuse Brother    Depression Daughter    Stroke Maternal Grandmother    Hypertension Maternal Grandmother    Hyperlipidemia Maternal Grandmother    Arthritis Maternal Grandmother    Stroke Paternal Grandmother    Arthritis Paternal Grandmother    Hypertension Paternal Grandmother     Medications- reviewed and updated Current Outpatient Medications  Medication Sig Dispense Refill   adapalene (DIFFERIN) 0.1 % gel 1 application in the evening Externally Once a day      ALPRAZolam (XANAX) 0.5 MG tablet Take 1 tablet (0.5 mg total) by mouth at bedtime as needed for anxiety. 10 tablet 0   Ibuprofen-Diphenhydramine Cit (ADVIL PM PO) Take 1 tablet by mouth at bedtime as needed.     No current facility-administered medications for this visit.    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 DP 2+B LUNGS: CTAB. No wheezes ABDOMEN:  BS+, soft, NTND, No HSM, no masses EXT:  no edema MSK: no gross abnormalities. MS 5/5 all 4 NEURO: A&O x3.  CN II-XII intact.  PSYCH: normal mood. Good eye contact  Breasts: reconstructed silicone implants. Left slightly tender, and extra bulge superiorly. Left axilla chronic, hyperpigmentation from radiation.    Assessment and Plan   Health Maintenance counseling: 1. Anticipatory guidance: Patient counseled regarding regular dental exams q6 months, eye exams,  avoiding smoking and second hand smoke, limiting alcohol to 1 beverage per day, no illicit drugs.   2. Risk factor reduction:  Advised patient of need for regular exercise and diet rich and fruits and vegetables to reduce risk of heart attack and stroke. Exercise- [place] dancing.  Wt Readings from Last 3 Encounters:  09/14/22 193 lb 6 oz (87.7 kg)   04/09/22 190 lb (86.2 kg)  03/16/22 190 lb 6.4 oz (86.4 kg)   3. Immunizations/screenings/ancillary studies Immunization History  Administered Date(s) Administered   Influenza Inj Mdck Quad Pf 01/07/2020   Influenza Whole 11/23/2007   Influenza,inj,Quad PF,6+ Mos 12/23/2016, 12/23/2017   Influenza-Unspecified 01/14/2022   Moderna Sars-Covid-2 Vaccination 06/02/2019, 01/15/2020   Pfizer Covid-19 Vaccine Bivalent Booster 41yrs & up 11/14/2020   Tdap 03/10/2007, 08/16/2015   Unspecified SARS-COV-2 Vaccination 01/14/2022   Health Maintenance Due  Topic Date Due   Colonoscopy  07/23/2020   PAP SMEAR-Modifier  07/06/2022    4. Cervical cancer screening- Last pap done 07/06/2019. Plans to schedule her next pap soon.  5. Breast cancer screening-  Hx of breast cancer, had double mastectomy.  6. Colon cancer screening - Overdue, last done 07/2010.  History of polyps. Dr. Elsie Lincoln sch but has needle phobia. 7. Skin cancer screening- advised regular sunscreen use. Denies worrisome, changing, or new skin lesions.  8. Birth control/STD check- N/a 9. Osteoporosis screening- Not done 10. Smoking associated screening - non smoker  Wellness examination -     CBC with Differential/Platelet; Future -     Comprehensive metabolic panel; Future -     Lipid panel; Future -     TSH; Future -     Hemoglobin A1c; Future  Malignant neoplasm of lower-outer quadrant of right female breast, unspecified estrogen receptor status (HCC)  Breast implant in situ  Breast pain, left  1  Wellness-anticipatory guidance.  Work on Diet/Exercise  Check CBC,CMP,lipids,TSH, A1C.  F/u 1 yr  2.  L breast/chest call pain after water slide.  ?rupture of implant(mastectomy in past), ?muscle tear, other.  Pt called plastic surgeon and rec MRI.   Recommended follow up: Return in about 1 year (around 09/14/2023) for annual physical-1 yr,  soon labs and nurse for shingrix.  Lab/Order associations: +fasting, had coffee with  half and half   I,Rachel Rivera,acting as a scribe for Angelena Sole, MD.,have documented all relevant documentation on the behalf of Angelena Sole, MD,as directed by  Angelena Sole, MD while in the presence of Angelena Sole, MD.  I, Angelena Sole, MD, have reviewed all documentation for this visit. The documentation on 09/14/22 for the exam, diagnosis, procedures, and orders are all accurate and complete.   Angelena Sole, MD

## 2022-09-14 NOTE — Telephone Encounter (Signed)
Please see message below and advise.

## 2022-09-14 NOTE — Telephone Encounter (Signed)
Patient states she spoke to Dr. Odis Luster, plastic surgeon, about breast pain and he informed her to have PCP order breast MRI for further evaluation. States Dr. Derryl Harbor said this would expedite the process. Please Advise.

## 2022-09-14 NOTE — Patient Instructions (Addendum)
It was very nice to see you today!  Call Dr. Loreta Ave, Dr. Hyacinth Meeker, Dr Odis Luster.   PLEASE NOTE:  If you had any lab tests please let us know if you have not heard back within a few days. You may see your results on MyChart before we have a chance to review them but we will give you a call once they are reviewed by [place]. If we ordered any referrals today, please let us know if you have not heard from their office within the next week.   Please try these tips to maintain a healthy lifestyle:  Eat most of your calories during the day when you are active. Eliminate processed foods including packaged sweets (pies, cakes, cookies), reduce intake of potatoes, white bread, white pasta, and white rice. Look for whole grain options, oat flour or almond flour.  Each meal should contain half fruits/vegetables, one quarter protein, and one quarter carbs (no bigger than a computer mouse).  Cut down on sweet beverages. This includes juice, soda, and sweet tea. Also watch fruit intake, though this is a healthier sweet option, it still contains natural sugar! Limit to 3 servings daily.  Drink at least 1 glass of water with each meal and aim for at least 8 glasses per day  Exercise at least 150 minutes every week.

## 2022-09-15 NOTE — Telephone Encounter (Signed)
Patient notified of message below and verbalized understanding. 

## 2022-10-30 ENCOUNTER — Ambulatory Visit: Admission: RE | Admit: 2022-10-30 | Payer: BC Managed Care – PPO | Source: Ambulatory Visit

## 2022-10-30 DIAGNOSIS — N644 Mastodynia: Secondary | ICD-10-CM | POA: Diagnosis not present

## 2022-10-30 DIAGNOSIS — Z9882 Breast implant status: Secondary | ICD-10-CM | POA: Diagnosis not present

## 2022-10-30 DIAGNOSIS — Z853 Personal history of malignant neoplasm of breast: Secondary | ICD-10-CM | POA: Diagnosis not present

## 2022-10-30 MED ORDER — GADOPICLENOL 0.5 MMOL/ML IV SOLN
9.0000 mL | Freq: Once | INTRAVENOUS | Status: AC | PRN
  Administered 2022-10-30: 9 mL via INTRAVENOUS

## 2022-11-06 ENCOUNTER — Other Ambulatory Visit: Payer: Self-pay | Admitting: *Deleted

## 2022-11-06 DIAGNOSIS — S2242XA Multiple fractures of ribs, left side, initial encounter for closed fracture: Secondary | ICD-10-CM

## 2022-11-06 DIAGNOSIS — Z853 Personal history of malignant neoplasm of breast: Secondary | ICD-10-CM | POA: Diagnosis not present

## 2022-11-11 DIAGNOSIS — C44511 Basal cell carcinoma of skin of breast: Secondary | ICD-10-CM | POA: Diagnosis not present

## 2022-12-10 ENCOUNTER — Ambulatory Visit
Admission: RE | Admit: 2022-12-10 | Discharge: 2022-12-10 | Disposition: A | Discharge status: Home / Self Care | Payer: BC Managed Care – PPO | Source: Ambulatory Visit | Attending: Family Medicine | Admitting: Family Medicine

## 2022-12-10 DIAGNOSIS — S2242XD Multiple fractures of ribs, left side, subsequent encounter for fracture with routine healing: Secondary | ICD-10-CM | POA: Diagnosis not present

## 2022-12-10 DIAGNOSIS — E041 Nontoxic single thyroid nodule: Secondary | ICD-10-CM | POA: Diagnosis not present

## 2022-12-10 DIAGNOSIS — S2242XA Multiple fractures of ribs, left side, initial encounter for closed fracture: Secondary | ICD-10-CM

## 2022-12-10 DIAGNOSIS — I700 Atherosclerosis of aorta: Secondary | ICD-10-CM | POA: Diagnosis not present

## 2022-12-31 NOTE — Progress Notes (Signed)
Ct-sorry took so long.  Shortage of radiologists.  Healing rib fractures! Old, but there, some calcifications in the heart-if having cp, sob, let us know.  Commonly seen on scans.  Stay active/exercise.  Get labs done from July to see what cholesterol is doing

## 2023-01-06 ENCOUNTER — Encounter: Payer: Self-pay | Admitting: *Deleted

## 2023-02-17 DIAGNOSIS — D485 Neoplasm of uncertain behavior of skin: Secondary | ICD-10-CM | POA: Diagnosis not present

## 2023-02-17 DIAGNOSIS — L719 Rosacea, unspecified: Secondary | ICD-10-CM | POA: Diagnosis not present

## 2023-02-17 DIAGNOSIS — L821 Other seborrheic keratosis: Secondary | ICD-10-CM | POA: Diagnosis not present

## 2023-02-17 DIAGNOSIS — D225 Melanocytic nevi of trunk: Secondary | ICD-10-CM | POA: Diagnosis not present

## 2023-02-17 DIAGNOSIS — C4441 Basal cell carcinoma of skin of scalp and neck: Secondary | ICD-10-CM | POA: Diagnosis not present

## 2023-02-17 DIAGNOSIS — L578 Other skin changes due to chronic exposure to nonionizing radiation: Secondary | ICD-10-CM | POA: Diagnosis not present

## 2023-04-09 ENCOUNTER — Encounter: Payer: Self-pay | Admitting: Pediatrics

## 2023-06-23 DIAGNOSIS — C4441 Basal cell carcinoma of skin of scalp and neck: Secondary | ICD-10-CM | POA: Diagnosis not present

## 2023-06-23 DIAGNOSIS — L988 Other specified disorders of the skin and subcutaneous tissue: Secondary | ICD-10-CM | POA: Diagnosis not present

## 2023-06-28 NOTE — Progress Notes (Signed)
 [facility] Initial Consultation   Referring Provider Denise Cousins, MD [address] [place],  [place] [address]  Primary Care Provider Denise Cousins, MD  Patient Profile: Denise Hull is a 63 y.o. female who is seen in consultation in the [facility] at the request of Dr. Waldo Hull for evaluation and management of the problem(s) noted below.  Problem List: Hematochezia History of colon polyps -tubular adenoma and sessile serrated polyps Dysphagia   History of Present Illness   Denise Hull is a 63 y.o. female with a history of aortic valve sclerosis, paroxysmal SVT, allergic rhinitis, Hodgkin's disease status post radiation/chemotherapy, breast cancer, basal cell carcinoma, HLD who was referred to the office for evaluation and management of hematochezia and dysphagia  Hematochezia/history of colon polyps -- Fall 2024 noted to have red material in her stool intermittently-she was unsure if it was blood or beets -- Notes that she saw the red material both in the toilet bowl and on the tissue paper after wiping -- Queries if she may have a hemorrhoid -- Reports that her stools are somewhat softer than in the past -- No constipation or straining -- No abdominal pain -- No anorectal discomfort  -- Last colonoscopy 2012 (Denise Hull) -3 polyps removed-tubular adenoma and sessile serrated polyps  -- States that her sister has had colon polyps -- No family history of colorectal cancer  Dysphagia -- Reports a longstanding history of dysphagia that has progressed over time -- Has stopped eating bagels and peanuts because of difficulty swallowing them -- Describes an episode of roast beef getting stuck in her throat -- Able to consume liquids and pills without difficulty -- No odynophagia -- Denies GERD, pyrosis or regurgitation -- States she is not sure if hayfever could be contributing -has seasonal allergies but no asthma --  Did not have the symptoms around the time of her radiation treatment for Hodgkin's disease many years ago  -- Reports a history of esophageal cancer in her father -- States that her brother has required esophageal dilation but she is unsure of the specific reason for dilation  Last colonoscopy:  2012 -3 polyps removed-pathology not available Last Hull: None  Last Abd CT/CTE/MRE: None recent  GI Review of Symptoms Significant for hematochezia and dysphagia. Otherwise negative.  General Review of Systems  Review of systems is significant for the pertinent positives and negatives as listed per the HPI.  Full ROS is otherwise negative.  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 Hobart Lulas   LYMPH NODE BIOPSY     MASTECTOMY Bilateral 08/28/08   PORTACATH PLACEMENT  7/10   removed 12/10   TIBIA FRACTURE SURGERY  12/11   Repair     Allergies and  Medications   Allergies  Allergen Reactions   Aspirin Other (See Comments)    REACTION: Nosebleeds   Sulfonamide Derivatives Other (See Comments)    Vaginal Burning    Sulfur Other (See Comments)    Current Meds  Medication Sig   cetirizine-pseudoephedrine (ZYRTEC-D) 5-120 MG tablet Take 1 tablet by mouth 2 (two) times daily.   Na Sulfate-K Sulfate-Mg Sulfate concentrate (SUPREP) 17.5-3.13-1.6 GM/177ML SOLN Use as directed;  may use generic; goodrx card if insurance will not cover generic     Family History   Family History  Problem Relation Age of Onset   Miscarriages / Stillbirths Mother    Hypertension Mother    Hyperlipidemia Mother    Thyroid  disease Mother    Cancer Father        esoph   Thyroid  disease Father    Depression Sister    Asthma Sister    Alcohol abuse Brother    Depression Daughter    Stroke Maternal Grandmother    Hypertension Maternal Grandmother    Hyperlipidemia Maternal Grandmother    Arthritis Maternal Grandmother    Stroke Paternal Grandmother    Arthritis Paternal Grandmother    Hypertension Paternal Grandmother      Social History   Social History   Tobacco Use   Smoking status: Never   Smokeless tobacco: Never  Vaping Use   Vaping status: Never Used  Substance Use Topics   Alcohol use: Yes    Alcohol/week: 4.0 standard drinks of alcohol    Types: 4 Glasses of wine per week    Comment: wine wirth dinner 4 nights a week   Drug use: No   Denise Hull reports that she has never smoked. She has never used smokeless tobacco. She reports current alcohol use of about 4.0 standard drinks of alcohol per week. She reports that she does not use drugs.  Vital Signs and Physical Examination   Vitals:   06/29/23 0851  BP: 130/72  Pulse: 82   Body mass index is 29.65 kg/m. Weight: 195 lb (88.5 kg)  General: Well developed, well nourished, no acute distress Head: Normocephalic and atraumatic Eyes: Sclerae anicteric, EOMI Lungs: Clear throughout to auscultation Heart: Regular rate and rhythm; No murmurs, rubs or bruits Abdomen: Soft, non tender and non distended. No masses, hepatosplenomegaly or hernias noted. Normal Bowel sounds Rectal: Deferred Musculoskeletal: Symmetrical with no gross deformities   Review of Data  The following data was reviewed at the time of this encounter:  Laboratory Studies      Latest Ref Rng & Units 02/04/2022    8:43 AM 12/22/2021     7:39 PM 03/25/2018    8:42 AM  CBC  WBC 4.0 - 10.5 K/uL 4.6  10.9  4.2   Hemoglobin 12.0 - 15.0 g/dL 40.9  81.1  91.4   Hematocrit 36.0 - 46.0 % 41.4  39.7  37.8   Platelets 150.0 - 400.0 K/uL 305.0  248  253     No results found for: "LIPASE"    Latest Ref Rng & Units 02/04/2022    8:43 AM 12/22/2021    7:39 PM 03/25/2018    8:42 AM  CMP  Glucose 70 - 99 mg/dL 97  782  84   BUN 6 - 23 mg/dL 18  16  28    Creatinine 0.40 - 1.20 mg/dL 9.56  2.13  0.86   Sodium 135 - 145 mEq/L 141  134  137   Potassium 3.5 - 5.1 mEq/L 6.0 No  hemolysis seen  3.7  4.1   Chloride 96 - 112 mEq/L 104  99  98   CO2 19 - 32 mEq/L 32  22  22   Calcium 8.4 - 10.5 mg/dL 16.1  9.6  9.9   Total Protein 6.0 - 8.3 g/dL 6.6   6.7   Total Bilirubin 0.2 - 1.2 mg/dL 0.6   0.4   Alkaline Phos 39 - 117 U/L 69   79   AST 0 - 37 U/L 16   16   ALT 0 - 35 U/L 12   14    Lab Results  Component Value Date   TSH 6.74 (H) 02/04/2022     Imaging Studies  None  GI Procedures and Studies   Colonoscopy 07/2010 (Denise Hull) 3 polyps removed-pathology not available Path: Tubular adenoma and sessile serrated polyps  Clinical Impression  It is my clinical impression that Denise Hull is a 63 y.o. female with;  Hematochezia History of colon polyps -tubular adenoma and sessile serrated polyps Dysphagia  Denise Hull presents with a primary complaint of suspected hematochezia.  She reports the presence of "red material" in her stool intermittently last fall.  She was unsure if it was blood or related to the consumption of beets.  She has noted softer stools but no other change in bowel habits.  Last colonoscopy performed in 2012 disclosed a tubular adenoma and sessile serrated polyps.  She is overdue for colon polyp surveillance.  In the setting of her recent symptoms discussed performing a colonoscopy and she is agreeable.  She also describes symptoms of dysphagia and choking on food.  This has been fairly  longstanding but progressed recently.  Her dysphagia is primarily to solids-bagels, nuts and meat.  Reports that she has required the Heimlich maneuver twice.  Denies a history of acid reflux.  She did have prior radiation to her chest for non-Hodgkin's lymphoma but denies any radiation type strictures diagnosed in the past.  She has a pertinent history of atopy and discussed the possibility of EOE.  She notes that her father had esophageal cancer and her brother has required esophageal dilations but she is not sure of the etiology of her brothers esophageal disorder.  I discussed performing an upper Hull at the same time as her colonoscopy to evaluate for strictures, biopsy for EOE and perform endoscopic dilation if appropriate.  We discussed potential risks of procedure including anesthesia, aspiration, perforation and bleeding.  Denise Hull notes that she has significant anxiety regarding IV insertions.  States that it relates to prior medical trauma in childhood.  Reports that she has Xanax  that she takes prior to needing blood draws or IVs for procedures.    Plan  Schedule EGD and colonoscopy at [facility] Continue to monitor stools for presence of blood Avoid foods that cause more difficulty with choking and chew food well until Hull has been completed  Planned Follow Up 3 months  The patient or caregiver verbalized understanding of the material covered, with no barriers to understanding. All questions were answered. Patient or caregiver is agreeable with the plan outlined above.    It was a pleasure to see Denise Hull.  If you have any questions or concerns regarding this evaluation, do not hesitate to contact me.  Eugenia Hess, MD [facility]

## 2023-06-29 ENCOUNTER — Ambulatory Visit: Payer: BC Managed Care – PPO | Admitting: Pediatrics

## 2023-06-29 ENCOUNTER — Encounter: Payer: Self-pay | Admitting: Pediatrics

## 2023-06-29 VITALS — BP 130/72 | HR 82 | Ht 68.0 in | Wt 195.0 lb

## 2023-06-29 DIAGNOSIS — Z860101 Personal history of adenomatous and serrated colon polyps: Secondary | ICD-10-CM | POA: Diagnosis not present

## 2023-06-29 DIAGNOSIS — K921 Melena: Secondary | ICD-10-CM

## 2023-06-29 DIAGNOSIS — Z1211 Encounter for screening for malignant neoplasm of colon: Secondary | ICD-10-CM

## 2023-06-29 DIAGNOSIS — R1310 Dysphagia, unspecified: Secondary | ICD-10-CM

## 2023-06-29 DIAGNOSIS — Z860100 Personal history of colon polyps, unspecified: Secondary | ICD-10-CM

## 2023-06-29 MED ORDER — NA SULFATE-K SULFATE-MG SULF 17.5-3.13-1.6 GM/177ML PO SOLN: ORAL | 0 refills | Status: DC

## 2023-06-29 NOTE — Patient Instructions (Addendum)
 You have been scheduled for an endoscopy and colonoscopy. Please follow the written instructions given to you at your visit today.  If you use inhalers (even only as needed), please bring them with you on the day of your procedure.  DO NOT TAKE 7 DAYS PRIOR TO TEST- Trulicity (dulaglutide) Ozempic, Wegovy (semaglutide) Mounjaro (tirzepatide) Bydureon Bcise (exanatide extended release)  DO NOT TAKE 1 DAY PRIOR TO YOUR TEST Rybelsus (semaglutide) Adlyxin (lixisenatide) Victoza (liraglutide) Byetta (exanatide) _____________________________________________________________________  Denise Hull have been scheduled for follow up with Dr McGreaol on Tuesday, 10/05/23 at 8:50 am.  _______________________________________________________  If your blood pressure at your visit was 140/90 or greater, please contact your primary care physician to follow up on this.  _______________________________________________________  If you are age 64 or older, your body mass index should be between 23-30. Your Body mass index is 29.65 kg/m. If this is out of the aforementioned range listed, please consider follow up with your Primary Care Provider.  If you are age 6 or younger, your body mass index should be between 19-25. Your Body mass index is 29.65 kg/m. If this is out of the aformentioned range listed, please consider follow up with your Primary Care Provider.   ________________________________________________________  The [facility] providers would like to encourage you to use MYCHART to communicate with providers for non-urgent requests or questions.  Due to long hold times on the telephone, sending your provider a message by [facility] may be a faster and more efficient way to get a response.  Please allow 48 business hours for a response.  Please remember that this is for non-urgent requests.  _______________________________________________________  Due to recent changes in healthcare laws, you may see the  results of your imaging and laboratory studies on MyChart before your provider has had a chance to review them.  We understand that in some cases there may be results that are confusing or concerning to you. Not all laboratory results come back in the same time frame and the provider may be waiting for multiple results in order to interpret others.  Please give us  48 hours in order for your provider to thoroughly review all the results before contacting the office for clarification of your results.

## 2023-07-07 DIAGNOSIS — C4441 Basal cell carcinoma of skin of scalp and neck: Secondary | ICD-10-CM | POA: Diagnosis not present

## 2023-07-21 ENCOUNTER — Encounter: Payer: Self-pay | Admitting: Pediatrics

## 2023-07-22 ENCOUNTER — Other Ambulatory Visit (INDEPENDENT_AMBULATORY_CARE_PROVIDER_SITE_OTHER)

## 2023-07-22 DIAGNOSIS — Z0000 Encounter for general adult medical examination without abnormal findings: Secondary | ICD-10-CM

## 2023-07-22 LAB — COMPREHENSIVE METABOLIC PANEL WITH GFR
ALT: 16 U/L (ref 0–35)
AST: 18 U/L (ref 0–37)
Albumin: 4.3 g/dL (ref 3.5–5.2)
Alkaline Phosphatase: 76 U/L (ref 39–117)
BUN: 18 mg/dL (ref 6–23)
CO2: 27 meq/L (ref 19–32)
Calcium: 9.4 mg/dL (ref 8.4–10.5)
Chloride: 102 meq/L (ref 96–112)
Creatinine, Ser: 0.76 mg/dL (ref 0.40–1.20)
GFR: 82.95 mL/min (ref >60.00)
Glucose, Bld: 110 mg/dL — ABNORMAL HIGH (ref 70–99)
Potassium: 3.9 meq/L (ref 3.5–5.1)
Sodium: 139 meq/L (ref 135–145)
Total Bilirubin: 0.7 mg/dL (ref 0.2–1.2)
Total Protein: 6.8 g/dL (ref 6.0–8.3)

## 2023-07-22 LAB — CBC WITH DIFFERENTIAL/PLATELET
Basophils Absolute: 0 10*3/uL (ref 0.0–0.1)
Basophils Relative: 0.9 % (ref 0.0–3.0)
Eosinophils Absolute: 0.2 10*3/uL (ref 0.0–0.7)
Eosinophils Relative: 3.9 % (ref 0.0–5.0)
HCT: 42.1 % (ref 36.0–46.0)
Hemoglobin: 14.1 g/dL (ref 12.0–15.0)
Lymphocytes Relative: 23.8 % (ref 12.0–46.0)
Lymphs Abs: 1.1 10*3/uL (ref 0.7–4.0)
MCHC: 33.4 g/dL (ref 30.0–36.0)
MCV: 90.3 fl (ref 78.0–100.0)
Monocytes Absolute: 0.4 10*3/uL (ref 0.1–1.0)
Monocytes Relative: 9.3 % (ref 3.0–12.0)
Neutro Abs: 2.8 10*3/uL (ref 1.4–7.7)
Neutrophils Relative %: 62.1 % (ref 43.0–77.0)
Platelets: 288 10*3/uL (ref 150.0–400.0)
RBC: 4.66 Mil/uL (ref 3.87–5.11)
RDW: 13.8 % (ref 11.5–15.5)
WBC: 4.4 10*3/uL (ref 4.0–10.5)

## 2023-07-22 LAB — LIPID PANEL
Cholesterol: 252 mg/dL — ABNORMAL HIGH (ref 0–200)
HDL: 82.6 mg/dL (ref >39.00)
LDL Cholesterol: 145 mg/dL — ABNORMAL HIGH (ref 0–99)
NonHDL: 169.75
Total CHOL/HDL Ratio: 3
Triglycerides: 125 mg/dL (ref 0.0–149.0)
VLDL: 25 mg/dL (ref 0.0–40.0)

## 2023-07-22 LAB — HEMOGLOBIN A1C: Hgb A1c MFr Bld: 6.1 % (ref 4.6–6.5)

## 2023-07-22 LAB — TSH: TSH: 7.95 u[IU]/mL — ABNORMAL HIGH (ref 0.35–5.50)

## 2023-07-23 DIAGNOSIS — H25013 Cortical age-related cataract, bilateral: Secondary | ICD-10-CM | POA: Diagnosis not present

## 2023-07-23 DIAGNOSIS — D3132 Benign neoplasm of left choroid: Secondary | ICD-10-CM | POA: Diagnosis not present

## 2023-07-23 DIAGNOSIS — H25043 Posterior subcapsular polar age-related cataract, bilateral: Secondary | ICD-10-CM | POA: Diagnosis not present

## 2023-07-25 ENCOUNTER — Ambulatory Visit: Payer: Self-pay | Admitting: Family Medicine

## 2023-07-25 NOTE — Progress Notes (Signed)
 1.  Thyroid  - looks like becoming hypo 2.  Choesterol too high-needs to work on it 3.  A1C(3 month average of sugars) is elevated.  This is considered PreDiabetes.  Work on diet-decrease sugars and starches and aim for 30 minutes of exercise 5 days/week to prevent progression to diabetes   She doesn't have appt till July-not sure why labs done now.  Can sch appt sooner to discuss or will discuss addn'l labs to figure out thyroid  in July

## 2023-07-30 ENCOUNTER — Telehealth: Payer: Self-pay | Admitting: *Deleted

## 2023-07-30 NOTE — Telephone Encounter (Signed)
 Pt concerned with losing sleep during prep prior to procedure. Asked if OK to tke benadryl to help with sleep. RN instructed her that she can take benadryl. Pt states she is petrified of getting an IV. Instructed pt to make sure she lets the RN's who will be doing the IV know as soon as she comes to the area to get ready for the procedure.Pt states she will. Noother questions at this time.

## 2023-08-02 NOTE — Progress Notes (Unsigned)
 [facility] History and Physical   Primary Care Physician:  Christel Cousins, MD   Reason for Procedure:  Dysphagia, surveillance of adenomatous colon polyps, possible hematochezia  Plan:    Upper endoscopy and colonoscopy     HPI: Denise Hull is a 63 y.o. female undergoing upper endoscopy and colonoscopy for investigation of dysphagia, surveillance of adenomatous colon polyps and possible hematochezia.  Patient reports longstanding history of dysphagia that has recently progressed-more to solids than liquids.  States she has required the Heimlich maneuver twice.  No history of acid reflux but did receive radiation to her chest for prior Hodgkin's lymphoma.  History of hayfever but no documented history of EOE.  Being evaluated for strictures and rule out EOE.  Last colonoscopy was performed in 2012 and revealed 3 polyps-tubular adenoma and sessile serrated polyps.  Has not had a follow-up colonoscopy since.  Family history of polyps in her sister.  No family history of colorectal cancer.   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 Hobart Lulas   LYMPH NODE BIOPSY     MASTECTOMY Bilateral 08/28/08   PORTACATH PLACEMENT  7/10   removed 12/10   TIBIA FRACTURE SURGERY  12/11   Repair    Prior to Admission medications   Medication Sig Start Date End Date Taking? Authorizing Provider  adapalene (DIFFERIN)  0.1 % gel 1 application in the evening Externally Once a day Patient not taking: Reported on 06/29/2023 08/06/20   [provider]  ALPRAZolam  (XANAX ) 0.5 MG tablet Take 1 tablet (0.5 mg total) by mouth at bedtime as needed for anxiety. Patient not taking: Reported on 06/29/2023 01/20/22   Christel Cousins, MD  cetirizine-pseudoephedrine (ZYRTEC-D) 5-120 MG tablet Take 1 tablet by mouth 2 (two) times daily.    [provider]  Ibuprofen-Diphenhydramine Cit (ADVIL PM PO) Take 1 tablet by mouth at bedtime as needed. Patient not taking: Reported on 06/29/2023    [provider]  Na Sulfate-K Sulfate-Mg Sulfate concentrate (SUPREP) 17.5-3.13-1.6 GM/177ML SOLN Use as directed; may use generic; goodrx card if insurance will not cover generic 06/29/23   Truddie Furrow, MD    Current Outpatient Medications  Medication Sig Dispense Refill   adapalene (DIFFERIN) 0.1 % gel 1 application in the evening Externally Once a day (Patient not taking: Reported on 06/29/2023)     ALPRAZolam  (XANAX ) 0.5 MG tablet Take 1 tablet (0.5 mg total) by mouth at bedtime as needed for anxiety. (Patient not taking: Reported on 06/29/2023) 10 tablet 0   cetirizine-pseudoephedrine (ZYRTEC-D) 5-120 MG tablet Take 1 tablet by mouth 2 (two) times daily.     Ibuprofen-Diphenhydramine Cit (ADVIL PM PO) Take 1 tablet by mouth at bedtime as needed. (Patient not taking: Reported on 06/29/2023)     Na Sulfate-K Sulfate-Mg  Sulfate concentrate (SUPREP) 17.5-3.13-1.6 GM/177ML SOLN Use as directed; may use generic; goodrx card if insurance will not cover generic 354 mL 0   No current facility-administered medications for this visit.    Allergies as of 08/03/2023 - Review Complete 06/29/2023  Allergen Reaction Noted   Aspirin Other (See Comments) 09/14/2022   Sulfonamide derivatives Other (See Comments)    Sulfur Other (See Comments) 09/14/2022    Family History  Problem Relation Age of Onset   Miscarriages /  Stillbirths Mother    Hypertension Mother    Hyperlipidemia Mother    Thyroid  disease Mother    Cancer Father        esoph   Thyroid  disease Father    Depression Sister    Asthma Sister    Alcohol abuse Brother    Depression Daughter    Stroke Maternal Grandmother    Hypertension Maternal Grandmother    Hyperlipidemia Maternal Grandmother    Arthritis Maternal Grandmother    Stroke Paternal Grandmother    Arthritis Paternal Grandmother    Hypertension Paternal Grandmother     Social History   Socioeconomic History   Marital status: Married    Spouse name: Not on file   Number of children: 1   Years of education: Not on file   Highest education level: Not on file  Occupational History   Occupation: retired  Tobacco Use   Smoking status: Never   Smokeless tobacco: Never  Vaping Use   Vaping status: Never Used  Substance and Sexual Activity   Alcohol use: Yes    Alcohol/week: 4.0 standard drinks of alcohol    Types: 4 Glasses of wine per week    Comment: wine wirth dinner 4 nights a week   Drug use: No   Sexual activity: Yes    Partners: Male    Birth control/protection: Post-menopausal  Other Topics Concern   Not on file  Social History Narrative   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  Physical Exam: Vital signs LMP 10/07/2008   General:   Alert,  Well-developed, well-nourished, pleasant and cooperative in NAD Airway:  Mallampati  Lungs:  Clear throughout to auscultation.   Heart:  Regular rate and rhythm; no murmurs, clicks, rubs,  or gallops. Abdomen:  Soft, nontender and nondistended. Normal bowel sounds.    Neuro/Psych:  Normal mood and affect. A and O x 3  Eugenia Hess, MD [facility]

## 2023-08-03 ENCOUNTER — Encounter: Payer: Self-pay | Admitting: Pediatrics

## 2023-08-03 ENCOUNTER — Ambulatory Visit: Admitting: Pediatrics

## 2023-08-03 VITALS — BP 120/63 | HR 73 | Temp 97.3°F | Resp 11 | Ht 68.0 in | Wt 195.0 lb

## 2023-08-03 DIAGNOSIS — K449 Diaphragmatic hernia without obstruction or gangrene: Secondary | ICD-10-CM | POA: Diagnosis not present

## 2023-08-03 DIAGNOSIS — D122 Benign neoplasm of ascending colon: Secondary | ICD-10-CM | POA: Diagnosis not present

## 2023-08-03 DIAGNOSIS — K644 Residual hemorrhoidal skin tags: Secondary | ICD-10-CM

## 2023-08-03 DIAGNOSIS — K2980 Duodenitis without bleeding: Secondary | ICD-10-CM

## 2023-08-03 DIAGNOSIS — D120 Benign neoplasm of cecum: Secondary | ICD-10-CM | POA: Diagnosis not present

## 2023-08-03 DIAGNOSIS — K6289 Other specified diseases of anus and rectum: Secondary | ICD-10-CM | POA: Diagnosis not present

## 2023-08-03 DIAGNOSIS — R1310 Dysphagia, unspecified: Secondary | ICD-10-CM

## 2023-08-03 DIAGNOSIS — Z1211 Encounter for screening for malignant neoplasm of colon: Secondary | ICD-10-CM | POA: Diagnosis not present

## 2023-08-03 DIAGNOSIS — K31A0 Gastric intestinal metaplasia, unspecified: Secondary | ICD-10-CM

## 2023-08-03 DIAGNOSIS — K648 Other hemorrhoids: Secondary | ICD-10-CM

## 2023-08-03 DIAGNOSIS — K2289 Other specified disease of esophagus: Secondary | ICD-10-CM | POA: Diagnosis not present

## 2023-08-03 DIAGNOSIS — K3189 Other diseases of stomach and duodenum: Secondary | ICD-10-CM | POA: Diagnosis not present

## 2023-08-03 MED ORDER — SODIUM CHLORIDE 0.9 % IV SOLN: 500.0000 mL | Freq: Once | INTRAVENOUS | Status: DC

## 2023-08-03 NOTE — Op Note (Signed)
 [facility] Patient Name: Denise Hull Procedure Date: 08/03/2023 7:38 AM MRN: [id number] Endoscopist: Eugenia Hess , MD, [id number] Age: 63 Referring MD:  Date of Birth: 1961/01/19 Gender: Female Account #: [id number] Procedure:                Colonoscopy Indications:              High risk colon cancer surveillance: Personal                            history of colonic polyps, , Last colonoscopy:                            2012, Family history of colon polyps in sister,                            Incidental notation of hematochezia Medicines:                Monitored Anesthesia Care Procedure:                Pre-Anesthesia Assessment:                           - Prior to the procedure, a History and Physical                            was performed, and patient medications and                            allergies were reviewed. The patient's tolerance of                            previous anesthesia was also reviewed. The risks                            and benefits of the procedure and the sedation                            options and risks were discussed with the patient.                            All questions were answered, and informed consent                            was obtained. Prior Anticoagulants: The patient has                            taken no anticoagulant or antiplatelet agents. ASA                            Grade Assessment: II - A patient with mild systemic                            disease. After reviewing the risks and benefits,  the patient was deemed in satisfactory condition to                            undergo the procedure.                           After obtaining informed consent, the colonoscope                            was passed under direct vision. Throughout the                            procedure, the patient's blood pressure, pulse, and                            oxygen saturations were  monitored continuously. The                            CF [id number] #[id number] was introduced through the anus                            and advanced to the cecum, identified by                            appendiceal orifice and ileocecal valve. The                            colonoscopy was somewhat difficult due to                            restricted mobility of the colon. Successful                            completion of the procedure was aided by                            withdrawing the scope and replacing with the                            pediatric colonoscope. The patient tolerated the                            procedure well. The quality of the bowel                            preparation was good. The ileocecal valve,                            appendiceal orifice, and rectum were photographed. Scope In: 8:37:45 AM Scope Out: 8:56:44 AM Scope Withdrawal Time: 0 hours 11 minutes 33 seconds  Total Procedure Duration: 0 hours 18 minutes 59 seconds  Findings:                 Skin tags were found on perianal exam.  The digital rectal exam findings include decreased                            sphincter tone. Pertinent negatives include no                            palpable rectal lesions.                           Two sessile polyps were found in the ascending                            colon and cecum. The polyps were 5 to 6 mm in size.                            These polyps were removed with a cold snare.                            Resection and retrieval were complete.                           Internal hemorrhoids were found during retroflexion. Complications:            No immediate complications. Estimated blood loss:                            Minimal. Estimated Blood Loss:     Estimated blood loss was minimal. Impression:               - Perianal skin tags found on perianal exam.                           - Decreased sphincter tone found on  digital rectal                            exam.                           - Two 5 to 6 mm polyps in the ascending colon and                            in the cecum, removed with a cold snare. Resected                            and retrieved.                           - Internal hemorrhoids. Recommendation:           - Discharge patient to home (ambulatory).                           - Await pathology results.                           - Repeat colonoscopy for surveillance based on  pathology results.                           - The findings and recommendations were discussed                            with the patient's family.                           - Return to GI clinic in 2 months.                           - Patient has a contact number available for                            emergencies. The signs and symptoms of potential                            delayed complications were discussed with the                            patient. Return to normal activities tomorrow.                            Written discharge instructions were provided to the                            patient. Eugenia Hess, MD 08/03/2023 9:03:23 AM This report has been signed electronically.

## 2023-08-03 NOTE — Op Note (Signed)
 [facility] Patient Name: Denise Hull Procedure Date: 08/03/2023 7:39 AM MRN: [id number] Endoscopist: Eugenia Hess , MD, [id number] Age: 63 Referring MD:  Date of Birth: Sep 03, 1960 Gender: Female Account #: [id number] Procedure:                Upper GI endoscopy Indications:              Dysphagia, Family history of esophageal cancer Medicines:                Monitored Anesthesia Care Procedure:                Pre-Anesthesia Assessment:                           - Prior to the procedure, a History and Physical                            was performed, and patient medications and                            allergies were reviewed. The patient's tolerance of                            previous anesthesia was also reviewed. The risks                            and benefits of the procedure and the sedation                            options and risks were discussed with the patient.                            All questions were answered, and informed consent                            was obtained. Prior Anticoagulants: The patient has                            taken no anticoagulant or antiplatelet agents. ASA                            Grade Assessment: II - A patient with mild systemic                            disease. After reviewing the risks and benefits,                            the patient was deemed in satisfactory condition to                            undergo the procedure.                           After obtaining informed consent, the endoscope was  passed under direct vision. Throughout the                            procedure, the patient's blood pressure, pulse, and                            oxygen saturations were monitored continuously. The                            GIF HQ190 #[id number] was introduced through the                            mouth, and advanced to the duodenal bulb. The upper                            GI  endoscopy was accomplished without difficulty.                            The patient tolerated the procedure well. Scope In: Scope Out: Findings:                 The examined esophagus was normal.                           No endoscopic abnormality was evident in the                            esophagus to explain the patient's complaint of                            dysphagia. It was decided, however, to proceed with                            dilation of the lower third of the esophagus and at                            the gastroesophageal junction. A TTS dilator was                            passed through the scope. Dilation with a                            15-16.5-18 mm balloon dilator was performed to 16.5                            mm. Biopsies were obtained from the proximal and                            distal esophagus with cold forceps for histology to                            evaluate for possible eosinophilic esophagitis.  The gastric body, gastric antrum, cardia (on                            retroflexion) and gastric fundus (on retroflexion)                            were normal. Biopsies were taken with a cold                            forceps for Helicobacter pylori testing.                           A small hiatal hernia was present.                           Moderately erythematous mucosa was found in the                            duodenal bulb and in the second portion of the                            duodenum. Biopsies were taken with a cold forceps                            for histology. Complications:            No immediate complications. Estimated blood loss:                            Minimal. Estimated Blood Loss:     Estimated blood loss was minimal. Impression:               - Normal esophagus.                           - No endoscopic esophageal abnormality to explain                            patient's dysphagia.  Esophagus dilated to 16.5 mm.                           - Normal gastric body, antrum, cardia and gastric                            fundus. Biopsied.                           - Small hiatal hernia.                           - Erythematous duodenopathy. Biopsied.                           - Biopsies were taken with a cold forceps for  evaluation of eosinophilic esophagitis. Recommendation:           - Await pathology results.                           - Perform a colonoscopy today.                           - The findings and recommendations were discussed                            with the patient's family. Eugenia Hess, MD 08/03/2023 8:34:17 AM This report has been signed electronically.

## 2023-08-03 NOTE — Progress Notes (Signed)
 Called to room to assist during endoscopic procedure.  Patient ID and intended procedure confirmed with present staff. Received instructions for my participation in the procedure from the performing physician.

## 2023-08-03 NOTE — Progress Notes (Signed)
 Report to PACU, RN, vss, BBS= Clear.

## 2023-08-03 NOTE — Progress Notes (Signed)
 Pt's states no medical or surgical changes since previsit or office visit.

## 2023-08-03 NOTE — Patient Instructions (Signed)
 Please read handouts provided. Await pathology results. Return to GI clinic in 2 months.   YOU HAD AN ENDOSCOPIC PROCEDURE TODAY AT THE [facility]:   Refer to the procedure report that was given to you for any specific questions about what was found during the examination.  If the procedure report does not answer your questions, please call your gastroenterologist to clarify.  If you requested that your care partner not be given the details of your procedure findings, then the procedure report has been included in a sealed envelope for you to review at your convenience later.  YOU SHOULD EXPECT: Some feelings of bloating in the abdomen. Passage of more gas than usual.  Walking can help get rid of the air that was put into your GI tract during the procedure and reduce the bloating. If you had a lower endoscopy (such as a colonoscopy or flexible sigmoidoscopy) you may notice spotting of blood in your stool or on the toilet paper. If you underwent a bowel prep for your procedure, you may not have a normal bowel movement for a few days.  Please Note:  You might notice some irritation and congestion in your nose or some drainage.  This is from the oxygen used during your procedure.  There is no need for concern and it should clear up in a day or so.  SYMPTOMS TO REPORT IMMEDIATELY:  Following lower endoscopy (colonoscopy or flexible sigmoidoscopy):  Excessive amounts of blood in the stool  Significant tenderness or worsening of abdominal pains  Swelling of the abdomen that is new, acute  Fever of 100F or higher  Following upper endoscopy (EGD)  Vomiting of blood or coffee ground material  New chest pain or pain under the shoulder blades  Painful or persistently difficult swallowing  New shortness of breath  Fever of 100F or higher  Black, tarry-looking stools  For urgent or emergent issues, a gastroenterologist can be reached at any hour by calling [phone]. Do not use  MyChart messaging for urgent concerns.    DIET:  We do recommend a small meal at first, but then you may proceed to your regular diet.  Drink plenty of fluids but you should avoid alcoholic beverages for 24 hours.  ACTIVITY:  You should plan to take it easy for the rest of today and you should NOT DRIVE or use heavy machinery until tomorrow (because of the sedation medicines used during the test).    FOLLOW UP: Our staff will call the number listed on your records the next business day following your procedure.  We will call around 7:15- 8:00 am to check on you and address any questions or concerns that you may have regarding the information given to you following your procedure. If we do not reach you, we will leave a message.     If any biopsies were taken you will be contacted by phone or by letter within the next 1-3 weeks.  Please call us  at [phone] if you have not heard about the biopsies in 3 weeks.    SIGNATURES/CONFIDENTIALITY: You and/or your care partner have signed paperwork which will be entered into your electronic medical record.  These signatures attest to the fact that that the information above on your After Visit Summary has been reviewed and is understood.  Full responsibility of the confidentiality of this discharge information lies with you and/or your care-partner.

## 2023-08-04 ENCOUNTER — Telehealth: Payer: Self-pay

## 2023-08-04 NOTE — Telephone Encounter (Signed)
  Follow up Call-     08/03/2023    7:37 AM  Call back number  Post procedure Call Back phone  # [phone]  Permission to leave phone message Yes     Patient questions:  Do you have a fever, pain , or abdominal swelling? No. Pain Score  0 *  Some throat soreness but otherwise no pain.  Have you tolerated food without any problems? Yes.    Have you been able to return to your normal activities? Yes.    Do you have any questions about your discharge instructions: Diet   No. Medications  No. Follow up visit  No.  Do you have questions or concerns about your Care? No.  Actions: * If pain score is 4 or above: No action needed, pain <4.

## 2023-08-06 LAB — SURGICAL PATHOLOGY

## 2023-08-09 ENCOUNTER — Ambulatory Visit: Payer: Self-pay | Admitting: Pediatrics

## 2023-08-17 ENCOUNTER — Telehealth: Payer: Self-pay | Admitting: Pediatrics

## 2023-08-17 DIAGNOSIS — L719 Rosacea, unspecified: Secondary | ICD-10-CM | POA: Diagnosis not present

## 2023-08-17 DIAGNOSIS — L821 Other seborrheic keratosis: Secondary | ICD-10-CM | POA: Diagnosis not present

## 2023-08-17 DIAGNOSIS — D225 Melanocytic nevi of trunk: Secondary | ICD-10-CM | POA: Diagnosis not present

## 2023-08-17 DIAGNOSIS — L578 Other skin changes due to chronic exposure to nonionizing radiation: Secondary | ICD-10-CM | POA: Diagnosis not present

## 2023-08-17 DIAGNOSIS — C44519 Basal cell carcinoma of skin of other part of trunk: Secondary | ICD-10-CM | POA: Diagnosis not present

## 2023-08-17 DIAGNOSIS — D485 Neoplasm of uncertain behavior of skin: Secondary | ICD-10-CM | POA: Diagnosis not present

## 2023-08-17 DIAGNOSIS — C44612 Basal cell carcinoma of skin of right upper limb, including shoulder: Secondary | ICD-10-CM | POA: Diagnosis not present

## 2023-08-17 NOTE — Telephone Encounter (Signed)
 Inbound call from patient, would like to discuss with Dr. Yvone Hull the possibility of being prescribed the medication for acid reflux. She states she has tried many over the counter and they are not working so she would like to now proceed with prescribed medications.

## 2023-08-18 MED ORDER — PANTOPRAZOLE SODIUM 40 MG PO TBEC: DELAYED_RELEASE_TABLET | ORAL | 3 refills | Status: DC

## 2023-08-18 NOTE — Telephone Encounter (Signed)
 Called and spoke with patient regarding Dr. Jarold Merlin recommendation for Pantoprazole 40 mg daily 3.0 minutes before breakfast. Patient requested that prescription be sent to [facility] in [place] on file. Patient is aware that there is a possibility that she may experience dizziness or headaches, should subside once body adjusts to medication. Patient is aware that she should trial medication at least for 4 weeks to determine if it improves symptoms. Patient verbalized understanding and had no concerns at the end of the call.   Pantoprazole 40 mg sent to requested pharmacy.

## 2023-09-11 DIAGNOSIS — M25572 Pain in left ankle and joints of left foot: Secondary | ICD-10-CM | POA: Diagnosis not present

## 2023-09-15 ENCOUNTER — Encounter: Payer: Self-pay | Admitting: Family Medicine

## 2023-09-15 ENCOUNTER — Ambulatory Visit: Payer: BC Managed Care – PPO | Admitting: Family Medicine

## 2023-09-15 VITALS — BP 125/82 | HR 77 | Temp 97.7°F | Resp 18 | Ht 68.0 in | Wt 195.0 lb

## 2023-09-15 DIAGNOSIS — E7800 Pure hypercholesterolemia, unspecified: Secondary | ICD-10-CM | POA: Diagnosis not present

## 2023-09-15 DIAGNOSIS — Z0000 Encounter for general adult medical examination without abnormal findings: Secondary | ICD-10-CM | POA: Diagnosis not present

## 2023-09-15 DIAGNOSIS — E039 Hypothyroidism, unspecified: Secondary | ICD-10-CM

## 2023-09-15 DIAGNOSIS — R7303 Prediabetes: Secondary | ICD-10-CM

## 2023-09-15 DIAGNOSIS — Z23 Encounter for immunization: Secondary | ICD-10-CM

## 2023-09-15 DIAGNOSIS — E2839 Other primary ovarian failure: Secondary | ICD-10-CM

## 2023-09-15 MED ORDER — LEVOTHYROXINE SODIUM 50 MCG PO TABS: 50.0000 ug | ORAL_TABLET | Freq: Every day | ORAL | 0 refills | Status: DC

## 2023-09-15 NOTE — Progress Notes (Signed)
 Phone [phone]   Subjective:   Patient is a 63 y.o. female presenting for annual physical.    Chief Complaint  Patient presents with   Annual Exam    CPE Discuss recent lab results No food, had coffee with half and half   Annual-discussed labs.  Exercises Discussed the use of AI scribe software for clinical note transcription with the patient, who gave verbal consent to proceed.  History of Present Illness Denise Hull is a 63 year old female who presents for annua physical, but also with a left ankle injury sustained during a fall in [place].  She sustained a left ankle injury on August 26, 2023, while on vacation in [place] after missing a step in a basement shop, landing hard on her knees and wrist. Initially, she was more concerned about her knee, but the ankle became the primary issue. She walked on the injured ankle for two weeks using a compression sleeve. Upon returning home, she was informed that her ankle was badly sprained with a small bone fragment where the ligament pulled. The pain is primarily across the top of her instep. She is currently using a boot and has a follow-up appointment scheduled with a physician at [facility].  She has a history of gastroesophageal reflux disease (GERD), which was identified during a recent colonoscopy/EGD. Despite experiencing episodes of choking on food severe enough to require the Heimlich maneuver three times, she was unaware of the GERD diagnosis until recently. She has started taking pantoprazole  after her trip to manage the condition. Her brother has Barrett's esophagus, and her father died of esophageal cancer, raising her concern about esophageal health.  She has arthritis in her knees, hands, and feet, confirmed by x-rays showing arthritis in both feet. She experiences poor grip strength but no significant pain or movement restriction in her fingers.  She has a history of osteopenia diagnosed in 2013. She has not had a  recent bone density scan.  She has a family history of thyroid  issues, with both parents having taken thyroid  medication. Her recent blood work showed a TSH level of 7.95. She feels exhausted frequently, which she attributes to being out of shape, but acknowledges it could be related to her thyroid  function.  She is active in a dance group and had been attending aerobics classes at the [facility] before her recent injury. She is focused on improving her balance and grip strength as part of her fitness goals. No headaches, dizziness, chest pain, shortness of breath, coughing, wheezing, vomiting, diarrhea, constipation, trouble urinating, vaginal bleeding, depression, or suicidal thoughts.    See problem oriented charting- ROS- ROS: Gen: no fever, chills  Skin: no rash, itching ENT: no ear pain, ear drainage, nasal congestion, rhinorrhea, sinus pressure, sore throat Eyes: no blurry vision, double vision Resp: no cough, wheeze,SOB CV: no CP, palpitations, LE edema,  GI: no heartburn, n/v/d/c, abd pain GU: no dysuria, urgency, frequency, hematuria MSK: ankle L Neuro: no dizziness, headache, weakness, vertigo Psych: no depression, anxiety, insomnia, SI   The following were reviewed and entered/updated in epic: Past Medical History:  Diagnosis Date   Abnormal Pap smear of cervix    Allergy    Anemia    Arthritis    Basal cell carcinoma    GERD (gastroesophageal reflux disease)    Heart murmur    History of Hodgkin's disease 1991   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+   Osteopenia    Shingles  Patient Active Problem List   Diagnosis Date Noted   Acquired hypothyroidism 09/15/2023   Prediabetes 09/15/2023   Aortic valve sclerosis 04/09/2022   Paroxysmal SVT (supraventricular tachycardia) (HCC) 04/09/2022   Pure hypercholesterolemia 04/09/2022   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   Breast cancer screening, high risk patient 07/09/2014   Breast cancer of  lower-outer quadrant of right female breast (HCC) 11/29/2012   GERD 12/29/2007   Allergic rhinitis 11/24/2007   FIBROIDS, UTERUS 11/23/2007   DEPRESSION, SITUATIONAL 11/23/2007   COUGH 11/23/2007   History of Hodgkin's disease 11/23/2007   BASAL CELL CARCINOMA, HX OF 11/23/2007   Past Surgical History:  Procedure Laterality Date   BASAL CELL CARCINOMA EXCISION     Removal   BREAST BIOPSY  5/10   Invasive ductal changes   BREAST LUMPECTOMY Bilateral 5/10   - nodes   LAPAROSCOPIC BILATERAL SALPINGO OOPHERECTOMY  03/07/09   with implant replacement with Dr. Leora   LYMPH NODE BIOPSY     MASTECTOMY Bilateral 08/28/08   PORTACATH PLACEMENT  7/10   removed 12/10   TIBIA FRACTURE SURGERY  12/11   Repair    Family History  Problem Relation Age of Onset   Miscarriages / Stillbirths Mother    Hypertension Mother    Hyperlipidemia Mother    Thyroid  disease Mother    Cancer Father        esoph   Thyroid  disease Father    Depression Sister    Asthma Sister    Alcohol abuse Brother    Depression Daughter    Stroke Maternal Grandmother    Hypertension Maternal Grandmother    Hyperlipidemia Maternal Grandmother    Arthritis Maternal Grandmother    Stroke Paternal Grandmother    Arthritis Paternal Grandmother    Hypertension Paternal Grandmother     Medications- reviewed and updated Current Outpatient Medications  Medication Sig Dispense Refill   adapalene (DIFFERIN) 0.1 % gel 1 application in the evening Externally Once a day     ALPRAZolam  (XANAX ) 0.5 MG tablet Take 1 tablet (0.5 mg total) by mouth at bedtime as needed for anxiety. 10 tablet 0   cetirizine-pseudoephedrine (ZYRTEC-D) 5-120 MG tablet Take 1 tablet by mouth 2 (two) times daily.     ibuprofen (ADVIL) 200 MG tablet      Ibuprofen-Diphenhydramine Cit (ADVIL PM PO) Take 1 tablet by mouth at bedtime as needed.     levothyroxine  (SYNTHROID ) 50 MCG tablet Take 1 tablet (50 mcg total) by mouth daily. 90 tablet 0    meloxicam (MOBIC) 7.5 MG tablet Take 1 tablet every day by oral route.     pantoprazole  (PROTONIX ) 40 MG tablet Take 1 tablet (40 mg total) by mouth 30 minutes before breakfast daily 30 tablet 3   No current facility-administered medications for this visit.    Allergies-reviewed and updated Allergies  Allergen Reactions   Aspirin Other (See Comments)    REACTION: Nosebleeds  Other Reaction(s): Other (See Comments), Other (See Comments), Unknown    Other reaction-nosebleeds    REACTION: Nosebleeds  Nose bleeds  aluminum aspirin  aspirin   Misc. 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 DP 2+B LUNGS: CTAB. No wheezes ABDOMEN:  BS+, soft, NTND, No HSM, no masses EXT:  no edema MSK: no gross abnormalities. MS 5/5 all 4.  Boot L leg NEURO: A&O x3.  CN II-XII intact.  PSYCH: normal mood. Good eye contact   Disc labs    Assessment and Plan   Health Maintenance counseling: 1. Anticipatory guidance: Patient counseled regarding  regular dental exams q6 months, eye exams,  avoiding smoking and second hand smoke, limiting alcohol to 1 beverage per day, no illicit drugs.   2. Risk factor reduction:  Advised patient of need for regular exercise and diet rich and fruits and vegetables to reduce risk of heart attack and stroke. Exercise- +.  Wt Readings from Last 3 Encounters:  09/15/23 195 lb (88.5 kg)  08/03/23 195 lb (88.5 kg)  06/29/23 195 lb (88.5 kg)   3. Immunizations/screenings/ancillary studies Immunization History  Administered Date(s) Administered   Influenza Inj Mdck Quad Pf 01/07/2020   Influenza Whole 11/23/2007   Influenza,inj,Quad PF,6+ Mos 12/23/2016, 12/23/2017, 01/14/2022   Influenza-Unspecified 01/14/2022   Moderna Sars-Covid-2 Vaccination 06/02/2019, 01/15/2020   Pfizer Covid-19 Vaccine Bivalent Booster 66yrs & up 11/14/2020   Pfizer(Comirnaty)Fall Seasonal Vaccine 12 years and older 12/08/2022   Tdap 03/10/2007, 08/16/2015   Unspecified SARS-COV-2 Vaccination 01/14/2022   Zoster Recombinant(Shingrix ) 09/15/2023   Health Maintenance Due  Topic Date Due   HIV Screening  Never done    4. Cervical cancer screening- utd 5. Breast cancer screening-  mammogram utd 6. Colon cancer screening - utd 7. Skin cancer screening- advised regular sunscreen use. Denies worrisome, changing, or new skin lesions.  8. Birth control/STD check- n/a 9. Osteoporosis screening- ordered 10. Smoking associated screening - non smoker  Wellness examination  Need for shingles vaccine -     Varicella-zoster vaccine IM  Estrogen deficiency -     DG Bone Density; Future  Acquired hypothyroidism -     Levothyroxine  Sodium; Take 1 tablet (50 mcg total) by mouth daily.  Dispense: 90 tablet; Refill: 0 -     TSH; Future  Prediabetes -     Comprehensive metabolic panel with GFR; Future -     Hemoglobin A1c; Future  Pure hypercholesterolemia -     Lipid panel; Future  1  Annual-antic guidance.  Shingrix   #1 Assessment and Plan Assessment & Plan Left Ankle Sprain with Avulsion Fracture   She sustained a left ankle sprain with an avulsion fracture on August 26, 2023, in [place], where a ligament pulled a small bone fragment, causing pain across the instep. Initially managed with a compression sleeve, she now uses a boot. No surgery is planned unless an MRI indicates further damage. She remains optimistic about recovery. Continue using the boot for support. Follow  up on October 02, 2023,ortho .  Gastroesophageal Reflux Disease (GERD)   She has previously undiagnosed GERD with significant esophageal reflux, leading to episodes of choking on food. There is a family history of Barrett's esophagus and esophageal cancer. Managed with pantoprazole , started post-travel to avoid side effects. Esophageal dilation was performed due to family history and symptoms. Continue pantoprazole  as prescribed and monitor for esophageal discomfort or choking.  Subclinical Hypothyroidism   She has subclinical hypothyroidism with a TSH level of 7.95 and a family history of thyroid  disorders. Symptoms include fatigue and difficulty losing weight. Treatment with levothyroxine  is initiated to potentially improve energy levels and metabolic parameters, though it may not resolve fatigue but could prevent further metabolic issues. Start levothyroxine  0.05mg , take on an empty stomach, separate from other medications, and recheck thyroid  levels in 2 months.  Prediabetes   Her A1c level of 6.1 indicates prediabetes. Emphasized dietary modifications to prevent progression to diabetes, focusing on reducing sugars and starches. She is motivated to avoid diabetes medication. Implement dietary changes to reduce sugar and starch intake and recheck blood glucose levels in 2 months.  Hyperlipidemia   She has elevated LDL cholesterol at 145 mg/dL. Discussed dietary modifications to improve lipid profile. No medication prescribed. Encouraged  lifestyle changes to avoid future medication. Implement dietary changes to reduce cholesterol intake and recheck lipid profile in 2 months.  Osteopenia   Diagnosed in 2013, she has had no recent bone density scan. Discussed the need for updated screening due to age and history of fractures. She is aware of the importance of monitoring bone health. Schedule a bone density scan.  General Health Maintenance   She is up to date with COVID and flu vaccinations. Discussed the need for shingles and pneumonia vaccinations. Proactive about vaccinations and preventive care. Administer the first dose of the shingles vaccine today, schedule the pneumonia vaccination in a few weeks, schedule the second dose of the shingles vaccine in 2-3 months, and schedule a Pap smear for next year.    Recommended follow up: Return in about 1 year (around 09/14/2024) for annual physical.  2 months lab and shingles #2.  Lab/Order associations:n/a fasting  Jenkins CHRISTELLA Carrel, MD

## 2023-09-15 NOTE — Patient Instructions (Signed)
 It was very nice to see you today!  Denise Hull     PLEASE NOTE:  If you had any lab tests please let us  know if you have not heard back within a few days. You may see your results on MyChart before we have a chance to review them but we will give you a call once they are reviewed by us . If we ordered any referrals today, please let us  know if you have not heard from their office within the next week.   Please try these tips to maintain a healthy lifestyle:  Eat most of your calories during the day when you are active. Eliminate processed foods including packaged sweets (pies, cakes, cookies), reduce intake of potatoes, white bread, white pasta, and white rice. Look for whole grain options, oat flour or almond flour.  Each meal should contain half fruits/vegetables, one quarter protein, and one quarter carbs (no bigger than a computer mouse).  Cut down on sweet beverages. This includes juice, soda, and sweet tea. Also watch fruit intake, though this is a healthier sweet option, it still contains natural sugar! Limit to 3 servings daily.  Drink at least 1 glass of water with each meal and aim for at least 8 glasses per day  Exercise at least 150 minutes every week.

## 2023-09-30 DIAGNOSIS — S93432A Sprain of tibiofibular ligament of left ankle, initial encounter: Secondary | ICD-10-CM | POA: Diagnosis not present

## 2023-09-30 DIAGNOSIS — S92102A Unspecified fracture of left talus, initial encounter for closed fracture: Secondary | ICD-10-CM | POA: Diagnosis not present

## 2023-10-04 DIAGNOSIS — M25572 Pain in left ankle and joints of left foot: Secondary | ICD-10-CM | POA: Diagnosis not present

## 2023-10-04 NOTE — Progress Notes (Unsigned)
 [facility] Return Visit   Referring Provider Wendolyn Jenkins Jansky, MD [address] [place],  [place] [address]  Primary Care Provider Wendolyn Jenkins Jansky, MD  Patient Profile: Denise Hull is a 63 y.o. female with a history of aortic valve sclerosis, paroxysmal SVT, allergic rhinitis, Hodgkin's disease status post radiation/chemotherapy, breast cancer, basal cell carcinoma, HLD who returns to the [facility] office for follow-up of the problem(s) noted below.  Problem List: Hematochezia History of colon polyps -tubular adenoma and sessile serrated polyps Dysphagia  History of Present Illness   Takera was last seen in the GI office 06/30/2023   Current GI Meds  Pantoprazole  40 mg orally daily  Interval History   Hematochezia/history of colon polyps -- Fall 2024 noted to have red material in her stool intermittently-she was unsure if it was blood or beets -- Notes that she saw the red material both in the toilet bowl and on the tissue paper after wiping -- States that her sister has had colon polyps -- No family history of colorectal cancer  -- Colonoscopy 2012 ([facility]) -3 polyps removed-tubular adenoma and sessile serrated polyps -- Colonoscopy 07/2023 - 2 TA and IH  -- Discussed that previous rectal bleeding/hematochezia likely related to hemorrhoids -- No further bleeding since colonoscopy -advised stool softeners/laxatives   Dysphagia -- Reports a longstanding history of dysphagia that has progressed over time -- Has stopped eating bagels and peanuts because of difficulty swallowing them -- Describes an episode of roast beef getting stuck in her throat -- Able to consume liquids and pills without difficulty -- No odynophagia -- Denies GERD, pyrosis or regurgitation -- States she is not sure if hayfever could be contributing -has seasonal allergies but no asthma -- Did not have the symptoms around the time of her  radiation treatment for Hodgkin's disease many years ago  -- Reports a history of esophageal cancer in her father -- States that her brother has required esophageal dilation but she is unsure of the specific reason for dilation  -- EGD 07/2023 - small HH, normal, empiric balloon dilation of lower esophagus/GE jct to 16.5 mm; esophagus biopsies suggestive of reflux --> advised a trial of pantoprazole  40 mg orally daily  -- States that dysphagia is somewhat improved since dilation -no longer has sensation of food hanging in her chest -- Still has difficulty swallowing meat and bread and feels a sticking sensation higher up when swallowing -- Experienced heartburn after dilation -- Now on pantoprazole  40 mg orally daily -- Has some symptoms of eructation but no significant heartburn or GERD  Last colonoscopy: 08/03/2023- two 5-6 mm polyps (TA), IH Last endoscopy: 08/03/2023- small HH, normal, empiric balloon dilation of lower esophagus/GE jct to 16.5 mm; esophagus biopsies suggestive of reflux  Last Abd CT/CTE/MRE: None recent  GI Review of Symptoms Significant for dysphagia. Otherwise negative.  General Review of Systems  Review of systems is significant for the pertinent positives and negatives as listed per the HPI.  Full ROS is otherwise negative.  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 Leora   LYMPH NODE BIOPSY     MASTECTOMY Bilateral 08/28/08   PORTACATH PLACEMENT  7/10   removed 12/10   TIBIA FRACTURE SURGERY  12/11   Repair     Allergies and Medications   Allergies  Allergen Reactions   Aspirin Other (See Comments)    REACTION: Nosebleeds  Other Reaction(s): Other (See Comments), Other (See Comments), Unknown    Other reaction-nosebleeds    REACTION: Nosebleeds  Nose bleeds  aluminum aspirin  aspirin   Misc. Sulfonamide Containing Compounds Dermatitis    Substance with sulfonamide structure and antibacterial mechanism of action (substance)   Sulfamethoxazole Other (See Comments)    No description of reaction noted; patient does not recall the reaction  sulfamethoxazole   Sulfonamide Derivatives Other (See Comments) and Dermatitis    Other Reaction(s): Other (See Comments)    No description of reaction noted     burning   Sulfur Other (See Comments)    Other Reaction(s): Other (See Comments), Unknown  sulfur    Current Meds  Medication Sig   adapalene (DIFFERIN) 0.1 % gel 1 application in the evening Externally Once a day   ALPRAZolam  (XANAX ) 0.5 MG tablet Take 1 tablet (0.5 mg total) by mouth at bedtime as needed for anxiety.   cetirizine-pseudoephedrine (ZYRTEC-D) 5-120 MG tablet Take 1 tablet by mouth 2 (two) times daily.   ibuprofen (ADVIL) 200 MG tablet    Ibuprofen-Diphenhydramine Cit (ADVIL PM PO) Take 1 tablet by mouth at bedtime as needed.   levothyroxine  (SYNTHROID ) 50 MCG tablet Take 1 tablet (50 mcg total) by mouth daily.   meloxicam (MOBIC) 7.5 MG tablet Take 1 tablet every day by oral route.   pantoprazole  (PROTONIX ) 40 MG tablet Take 1 tablet (40 mg total) by mouth 30 minutes before breakfast daily     Family History   Family History  Problem Relation Age of Onset   Miscarriages / Stillbirths Mother    Hypertension Mother    Hyperlipidemia Mother    Thyroid  disease Mother    Cancer Father         esoph   Thyroid  disease Father    Depression Sister    Asthma Sister    Alcohol abuse Brother    Depression Daughter    Stroke Maternal Grandmother    Hypertension Maternal Grandmother    Hyperlipidemia Maternal Grandmother    Arthritis Maternal Grandmother    Stroke Paternal Grandmother    Arthritis Paternal Grandmother    Hypertension Paternal Grandmother      Social History   Social History   Tobacco Use   Smoking status: Never   Smokeless tobacco: Never  Vaping Use   Vaping status: Never Used  Substance Use Topics   Alcohol use: Yes    Alcohol/week: 4.0 standard drinks of alcohol    Types: 4 Glasses of wine per week    Comment: wine wirth dinner 4 nights a week   Drug use: No   Blannie reports that she has never smoked. She has never used smokeless tobacco. She reports current alcohol use of about 4.0 standard drinks of alcohol per week. She reports that she  does not use drugs.  Vital Signs and Physical Examination   Vitals:   10/05/23 0849  BP: 124/72  Pulse: 82    Body mass index is 29.39 kg/m. Weight: 193 lb 5 oz (87.7 kg)  General: Well developed, well nourished, no acute distress Head: Normocephalic and atraumatic Eyes: Sclerae anicteric, EOMI Lungs: Clear throughout to auscultation Heart: Regular rate and rhythm; No murmurs, rubs or bruits Abdomen: Soft, non tender and non distended. No masses, hepatosplenomegaly or hernias noted. Normal Bowel sounds Rectal: Deferred Musculoskeletal: Symmetrical with no gross deformities   Review of Data  The following data was reviewed at the time of this encounter:  Laboratory Studies      Latest Ref Rng & Units 07/22/2023    9:35 AM 02/04/2022    8:43 AM 12/22/2021    7:39 PM  CBC  WBC 4.0 - 10.5 K/uL 4.4  4.6  10.9   Hemoglobin 12.0 - 15.0 g/dL 85.8  86.3  86.6   Hematocrit 36.0 - 46.0 % 42.1  41.4  39.7   Platelets 150.0 - 400.0 K/uL 288.0  305.0  248     No results found for: LIPASE    Latest  Ref Rng & Units 07/22/2023    9:35 AM 02/04/2022    8:43 AM 12/22/2021    7:39 PM  CMP  Glucose 70 - 99 mg/dL 889  97  778   BUN 6 - 23 mg/dL 18  18  16    Creatinine 0.40 - 1.20 mg/dL 9.23  9.23  9.25   Sodium 135 - 145 mEq/L 139  141  134   Potassium 3.5 - 5.1 mEq/L 3.9  6.0 No hemolysis seen  3.7   Chloride 96 - 112 mEq/L 102  104  99   CO2 19 - 32 mEq/L 27  32  22   Calcium 8.4 - 10.5 mg/dL 9.4  89.9  9.6   Total Protein 6.0 - 8.3 g/dL 6.8  6.6    Total Bilirubin 0.2 - 1.2 mg/dL 0.7  0.6    Alkaline Phos 39 - 117 U/L 76  69    AST 0 - 37 U/L 18  16    ALT 0 - 35 U/L 16  12     Lab Results  Component Value Date   TSH 7.95 (H) 07/22/2023     Imaging Studies  None  GI Procedures and Studies  EGD/Colonoscopy 07/2023 EGD - small HH, normal, empiric balloon dilation of lower esophagus/GE jct to 16.5 mm; esophagus biopsies suggestive of reflux Colonoscopy - two 5-6 mm polyps (TA), IH Path  1. Surgical [P], duodenal biopsy :      -  DUODENAL MUCOSA WITH FOCAL FOVEOLAR METAPLASIA SUGGESTIVE OF CHRONIC PEPTIC      DUODENITIS IN THE BACKGROUND OF SEPARATE FRAGMENTS OF DUODENAL MUCOSA WITH NO      SIGNIFICANT PATHOLOGY.       2. Surgical [P], gastric biopsy :      -  ANTRAL AND OXYNTIC MUCOSA WITH NO SIGNIFICANT PATHOLOGY.      -  NO HELICOBACTER PYLORI ORGANISMS IDENTIFIED ON H&E STAINED SLIDE.       3. Surgical [P], lower esophagus biopsy :      -  SQUAMOUS MUCOSA WITH BASAL CELL HYPERPLASIA AND PAPILLARY ELONGATION      SUGGESTIVE OF REFLUX ESOPHAGITIS.       4. Surgical [P], upper esophagus biopsy :      -  SQUAMOUS MUCOSA WITH MILD BASAL  CELL HYPERPLASIA AND PAPILLARY ELONGATION,      SUGGESTIVE OF REFLUX (INCREASED INTRAEPITHELIAL EOSINOPHILS ARE NOT PRESENT).       5. Surgical [P], colon, cecum, ascending, polyp (2) :      -  TUBULAR ADENOMA, FRAGMENTS.   Colonoscopy 07/2010 ([facility]) 3 polyps removed-pathology not available Path: Tubular adenoma and  sessile serrated polyps  Clinical Impression  It is my clinical impression that Ms. Kagawa is a 63 y.o. female with;  Hematochezia History of colon polyps -tubular adenoma and sessile serrated polyps Dysphagia  Ms. Howk presented in April 2025 reporting hematochezia.  Colonoscopy 07/2023 disclosed 2 tubular adenomas as well as internal hemorrhoids which likely explained her intermittent hematochezia.  Prior colonoscopy in 2012 showed a tubular adenoma and sessile serrated polyps.  Based upon her recent colonoscopy I recommended a 7-year follow-up interval in May 2032.  She also describes symptoms of dysphagia and choking on food.  This has been fairly longstanding but progressed recently.  Her dysphagia is primarily to solids-bagels, nuts and meat.  Reports that she has required the Heimlich maneuver twice.  Denies a history of acid reflux.  She did have prior radiation to her chest for non-Hodgkin's lymphoma but denies any radiation type strictures diagnosed in the past.   She notes that her father had esophageal cancer and her brother has required esophageal dilations but she is not sure of the etiology of her brothers esophageal disorder.  EGD 07/2023 showed a normal-appearing esophagus.  Biopsies negative for EOE but did show reflux changes.  An empiric dilation was performed of the lower esophagus and GE junction with a 16.5 mm balloon.  Given reflux changes on biopsies she was started on pantoprazole  40 mg orally daily.  At today's visit she reports that her dysphagia has improved somewhat after dilation of her esophagus in the sense that she no longer feels food hanging in her chest.  She does continue to endorse some difficulty swallowing meats and breads that occur as she is attempting to swallow a food bolus.  Reviewed that the symptoms could be related to acid reflux, oropharyngeal dysphagia or could require esophageal dilation to a larger diameter.  At this juncture she is dealing with  the recent orthopedic injury and prefers to monitor her symptoms without further changes at this time.  Plan  Continue pantoprazole  40 mg orally daily 20 to 30 minutes before meal for an additional month.  If symptoms continue to improve we can discuss a longer-term trial of pantoprazole  before tapering off.  If symptoms do not improve reviewed that she may be able to discontinue the medication. If dysphagia persist discussed options of a modified barium swallow, repeat EGD with dilation to a larger diameter for consideration of esophageal manometry. GERD diet and lifestyle modification; she will continue to monitor her diet and chew food well. Next colonoscopy due 07/2030  Planned Follow Up I discussed with Nia leaving follow-up open-ended.  She will contact our office if she wishes to pursue additional evaluation and workup of her dysphagia.  The patient or caregiver verbalized understanding of the material covered, with no barriers to understanding. All questions were answered. Patient or caregiver is agreeable with the plan outlined above.    It was a pleasure to see Lorena.  If you have any questions or concerns regarding this evaluation, do not hesitate to contact me.  Inocente Hausen, MD [facility]   I spent total of 30 minutes in both face-to-face (20 minutes interview) and  non-face-to-face (10 minutes chart review, care coordination, documentation)  activities, excluding procedures performed, for the visit on the date of this encounter.

## 2023-10-05 ENCOUNTER — Ambulatory Visit: Admitting: Pediatrics

## 2023-10-05 ENCOUNTER — Encounter: Payer: Self-pay | Admitting: Pediatrics

## 2023-10-05 VITALS — BP 124/72 | HR 82 | Wt 193.3 lb

## 2023-10-05 DIAGNOSIS — R1310 Dysphagia, unspecified: Secondary | ICD-10-CM

## 2023-10-05 DIAGNOSIS — Z860101 Personal history of adenomatous and serrated colon polyps: Secondary | ICD-10-CM | POA: Diagnosis not present

## 2023-10-05 DIAGNOSIS — K625 Hemorrhage of anus and rectum: Secondary | ICD-10-CM | POA: Diagnosis not present

## 2023-10-05 DIAGNOSIS — Z860100 Personal history of colon polyps, unspecified: Secondary | ICD-10-CM

## 2023-10-05 DIAGNOSIS — K649 Unspecified hemorrhoids: Secondary | ICD-10-CM

## 2023-10-05 DIAGNOSIS — K219 Gastro-esophageal reflux disease without esophagitis: Secondary | ICD-10-CM

## 2023-10-05 NOTE — Patient Instructions (Signed)
 Follow up as needed.  Thank you for entrusting me with your care and for choosing [facility], Dr. Inocente Hausen  _______________________________________________________  If your blood pressure at your visit was 140/90 or greater, please contact your primary care physician to follow up on this.  _______________________________________________________  If you are age 64 or older, your body mass index should be between 23-30. Your Body mass index is 29.39 kg/m. If this is out of the aforementioned range listed, please consider follow up with your Primary Care Provider.  If you are age 107 or younger, your body mass index should be between 19-25. Your Body mass index is 29.39 kg/m. If this is out of the aformentioned range listed, please consider follow up with your Primary Care Provider.   ________________________________________________________  The [facility] providers would like to encourage you to use MYCHART to communicate with providers for non-urgent requests or questions.  Due to long hold times on the telephone, sending your provider a message by [facility] may be a faster and more efficient way to get a response.  Please allow 48 business hours for a response.  Please remember that this is for non-urgent requests.  _______________________________________________________  [facility] is using a team-based approach to care.  Your team is made up of your doctor and two to three APPS. Our APPS (Nurse Practitioners and Physician Assistants) work with your physician to ensure care continuity for you. They are fully qualified to address your health concerns and develop a treatment plan. They communicate directly with your gastroenterologist to care for you. Seeing the Advanced Practice Practitioners on your physician's team can help you by facilitating care more promptly, often allowing for earlier appointments, access to diagnostic testing, procedures, and other specialty  referrals.

## 2023-10-07 ENCOUNTER — Other Ambulatory Visit: Payer: Self-pay | Admitting: Orthopaedic Surgery

## 2023-10-07 DIAGNOSIS — S92102A Unspecified fracture of left talus, initial encounter for closed fracture: Secondary | ICD-10-CM

## 2023-10-11 ENCOUNTER — Ambulatory Visit
Admission: RE | Admit: 2023-10-11 | Discharge: 2023-10-11 | Disposition: A | Discharge status: Home / Self Care | Source: Ambulatory Visit | Attending: Orthopaedic Surgery | Admitting: Orthopaedic Surgery

## 2023-10-11 DIAGNOSIS — S93402A Sprain of unspecified ligament of left ankle, initial encounter: Secondary | ICD-10-CM | POA: Diagnosis not present

## 2023-10-11 DIAGNOSIS — S92102A Unspecified fracture of left talus, initial encounter for closed fracture: Secondary | ICD-10-CM

## 2023-10-15 DIAGNOSIS — S92102A Unspecified fracture of left talus, initial encounter for closed fracture: Secondary | ICD-10-CM | POA: Diagnosis not present

## 2023-10-19 DIAGNOSIS — C44519 Basal cell carcinoma of skin of other part of trunk: Secondary | ICD-10-CM | POA: Diagnosis not present

## 2023-10-19 DIAGNOSIS — C4441 Basal cell carcinoma of skin of scalp and neck: Secondary | ICD-10-CM | POA: Diagnosis not present

## 2023-10-26 DIAGNOSIS — H02831 Dermatochalasis of right upper eyelid: Secondary | ICD-10-CM | POA: Diagnosis not present

## 2023-10-26 DIAGNOSIS — H2513 Age-related nuclear cataract, bilateral: Secondary | ICD-10-CM | POA: Diagnosis not present

## 2023-10-26 DIAGNOSIS — H25043 Posterior subcapsular polar age-related cataract, bilateral: Secondary | ICD-10-CM | POA: Diagnosis not present

## 2023-10-26 DIAGNOSIS — H18413 Arcus senilis, bilateral: Secondary | ICD-10-CM | POA: Diagnosis not present

## 2023-10-26 DIAGNOSIS — H25013 Cortical age-related cataract, bilateral: Secondary | ICD-10-CM | POA: Diagnosis not present

## 2023-11-02 DIAGNOSIS — C44519 Basal cell carcinoma of skin of other part of trunk: Secondary | ICD-10-CM | POA: Diagnosis not present

## 2023-11-02 DIAGNOSIS — L988 Other specified disorders of the skin and subcutaneous tissue: Secondary | ICD-10-CM | POA: Diagnosis not present

## 2023-11-10 ENCOUNTER — Other Ambulatory Visit: Payer: Self-pay | Admitting: Family Medicine

## 2023-11-10 DIAGNOSIS — E039 Hypothyroidism, unspecified: Secondary | ICD-10-CM

## 2023-11-13 ENCOUNTER — Other Ambulatory Visit: Payer: Self-pay | Admitting: Pediatrics

## 2023-11-16 ENCOUNTER — Ambulatory Visit (INDEPENDENT_AMBULATORY_CARE_PROVIDER_SITE_OTHER)

## 2023-11-16 ENCOUNTER — Other Ambulatory Visit (INDEPENDENT_AMBULATORY_CARE_PROVIDER_SITE_OTHER)

## 2023-11-16 DIAGNOSIS — E7800 Pure hypercholesterolemia, unspecified: Secondary | ICD-10-CM | POA: Diagnosis not present

## 2023-11-16 DIAGNOSIS — R7303 Prediabetes: Secondary | ICD-10-CM

## 2023-11-16 DIAGNOSIS — Z23 Encounter for immunization: Secondary | ICD-10-CM | POA: Diagnosis not present

## 2023-11-16 DIAGNOSIS — E039 Hypothyroidism, unspecified: Secondary | ICD-10-CM | POA: Diagnosis not present

## 2023-11-16 LAB — LIPID PANEL
Cholesterol: 261 mg/dL — ABNORMAL HIGH (ref 0–200)
HDL: 74 mg/dL (ref >39.00)
LDL Cholesterol: 161 mg/dL — ABNORMAL HIGH (ref 0–99)
NonHDL: 186.69
Total CHOL/HDL Ratio: 4
Triglycerides: 126 mg/dL (ref 0.0–149.0)
VLDL: 25.2 mg/dL (ref 0.0–40.0)

## 2023-11-16 LAB — COMPREHENSIVE METABOLIC PANEL WITH GFR
ALT: 18 U/L (ref 0–35)
AST: 20 U/L (ref 0–37)
Albumin: 4.5 g/dL (ref 3.5–5.2)
Alkaline Phosphatase: 71 U/L (ref 39–117)
BUN: 20 mg/dL (ref 6–23)
CO2: 28 meq/L (ref 19–32)
Calcium: 10.1 mg/dL (ref 8.4–10.5)
Chloride: 101 meq/L (ref 96–112)
Creatinine, Ser: 0.8 mg/dL (ref 0.40–1.20)
GFR: 77.82 mL/min (ref >60.00)
Glucose, Bld: 97 mg/dL (ref 70–99)
Potassium: 4 meq/L (ref 3.5–5.1)
Sodium: 138 meq/L (ref 135–145)
Total Bilirubin: 0.7 mg/dL (ref 0.2–1.2)
Total Protein: 7.4 g/dL (ref 6.0–8.3)

## 2023-11-16 LAB — TSH: TSH: 2.52 u[IU]/mL (ref 0.35–5.50)

## 2023-11-16 LAB — HEMOGLOBIN A1C: Hgb A1c MFr Bld: 6.5 % (ref 4.6–6.5)

## 2023-11-16 NOTE — Progress Notes (Signed)
 Patient is in office today for a nurse visit for Immunization, per PCP's order. Patient Injection was given in the  Right deltoid. Patient tolerated injection well.

## 2023-11-17 ENCOUNTER — Ambulatory Visit: Payer: Self-pay | Admitting: Family Medicine

## 2023-11-17 NOTE — Progress Notes (Signed)
 Cholesterol worse-does she want to start meds(sch appt), or work on it 3 more months and needs appt w/me for f/u A1C now 6.5 which is technically diabetes.  Does she want meds(sch appt if so) or work on for 3 more months and sch w/me for reck? Thyroid  better, but could use adjustment-increase to 0.075mg (send rx #90/0 but can do 1.5 tabs of her 50's) and sch appt w/me in 3 mo for reck

## 2023-11-18 ENCOUNTER — Other Ambulatory Visit: Payer: Self-pay | Admitting: *Deleted

## 2023-11-18 DIAGNOSIS — S92102A Unspecified fracture of left talus, initial encounter for closed fracture: Secondary | ICD-10-CM | POA: Diagnosis not present

## 2023-11-18 DIAGNOSIS — E039 Hypothyroidism, unspecified: Secondary | ICD-10-CM

## 2023-11-18 MED ORDER — LEVOTHYROXINE SODIUM 50 MCG PO TABS: 75.0000 ug | ORAL_TABLET | Freq: Every day | ORAL | 1 refills | Status: DC

## 2024-01-17 ENCOUNTER — Telehealth: Payer: Self-pay | Admitting: Family Medicine

## 2024-01-17 ENCOUNTER — Other Ambulatory Visit: Payer: Self-pay | Admitting: Family Medicine

## 2024-01-17 DIAGNOSIS — E039 Hypothyroidism, unspecified: Secondary | ICD-10-CM

## 2024-01-17 MED ORDER — LEVOTHYROXINE SODIUM 75 MCG PO TABS: 75.0000 ug | ORAL_TABLET | Freq: Every day | ORAL | 0 refills | Status: DC

## 2024-01-17 NOTE — Telephone Encounter (Unsigned)
 Copied from CRM #[id number]. Topic: Clinical - Medication Refill >> Jan 17, 2024 11:49 AM Thersia C wrote: Medication: levothyroxine  (SYNTHROID ) 50 MCG tablet - patient would like to get the increased dosage to 75  Has the patient contacted their pharmacy? Yes (Agent: If no, request that the patient contact the pharmacy for the refill. If patient does not wish to contact the pharmacy document the reason why and proceed with request.) (Agent: If yes, when and what did the pharmacy advise?)  This is the patient's preferred pharmacy:  [id number] [place], [place] - [address] [place] [address] Phone: [phone] Fax: [phone]  Is this the correct pharmacy for this prescription? Yes If no, delete pharmacy and type the correct one.   Has the prescription been filled recently? No  Is the patient out of the medication? Yes  Has the patient been seen for an appointment in the last year OR does the patient have an upcoming appointment? Yes  Can we respond through MyChart? Yes  Agent: Please be advised that Rx refills may take up to 3 business days. We ask that you follow-up with your pharmacy.

## 2024-02-17 DIAGNOSIS — S92102D Unspecified fracture of left talus, subsequent encounter for fracture with routine healing: Secondary | ICD-10-CM | POA: Diagnosis not present

## 2024-02-24 DIAGNOSIS — C44519 Basal cell carcinoma of skin of other part of trunk: Secondary | ICD-10-CM | POA: Diagnosis not present

## 2024-02-24 DIAGNOSIS — D225 Melanocytic nevi of trunk: Secondary | ICD-10-CM | POA: Diagnosis not present

## 2024-02-24 DIAGNOSIS — L821 Other seborrheic keratosis: Secondary | ICD-10-CM | POA: Diagnosis not present

## 2024-02-24 DIAGNOSIS — L578 Other skin changes due to chronic exposure to nonionizing radiation: Secondary | ICD-10-CM | POA: Diagnosis not present

## 2024-02-24 DIAGNOSIS — Z85828 Personal history of other malignant neoplasm of skin: Secondary | ICD-10-CM | POA: Diagnosis not present

## 2024-02-24 DIAGNOSIS — D485 Neoplasm of uncertain behavior of skin: Secondary | ICD-10-CM | POA: Diagnosis not present

## 2024-02-29 DIAGNOSIS — D3132 Benign neoplasm of left choroid: Secondary | ICD-10-CM | POA: Diagnosis not present

## 2024-02-29 DIAGNOSIS — H25013 Cortical age-related cataract, bilateral: Secondary | ICD-10-CM | POA: Diagnosis not present

## 2024-04-13 ENCOUNTER — Other Ambulatory Visit: Payer: Self-pay

## 2024-04-13 ENCOUNTER — Other Ambulatory Visit: Payer: Self-pay | Admitting: Family Medicine

## 2024-04-13 DIAGNOSIS — E039 Hypothyroidism, unspecified: Secondary | ICD-10-CM

## 2024-04-13 MED ORDER — LEVOTHYROXINE SODIUM 75 MCG PO TABS: 75.0000 ug | ORAL_TABLET | Freq: Every day | ORAL | 0 refills | Status: AC

## 2024-09-15 ENCOUNTER — Encounter: Admitting: Family Medicine
